# Patient Record
Sex: Female | Born: 1965 | Race: White | Hispanic: No | Marital: Married
Health system: Midwestern US, Academic
[De-identification: ages and names within clinical notes are randomized; demographics above are authoritative.]

---

## 2017-01-12 ENCOUNTER — Ambulatory Visit: Admit: 2017-01-12 | Discharge: 2017-01-13 | Payer: Private Health Insurance - Indemnity

## 2017-01-12 ENCOUNTER — Encounter: Admit: 2017-01-12 | Discharge: 2017-01-12 | Payer: BC Managed Care – HMO

## 2017-01-12 DIAGNOSIS — N200 Calculus of kidney: ICD-10-CM

## 2017-01-12 DIAGNOSIS — J302 Other seasonal allergic rhinitis: ICD-10-CM

## 2017-01-12 DIAGNOSIS — R0902 Hypoxemia: ICD-10-CM

## 2017-01-12 DIAGNOSIS — R4189 Other symptoms and signs involving cognitive functions and awareness: ICD-10-CM

## 2017-01-12 DIAGNOSIS — R51 Headache: ICD-10-CM

## 2017-01-12 DIAGNOSIS — J45909 Unspecified asthma, uncomplicated: Principal | ICD-10-CM

## 2017-01-12 DIAGNOSIS — N809 Endometriosis, unspecified: ICD-10-CM

## 2017-01-12 NOTE — Progress Notes
Neuropsychological Evaluation    Name: Ashley Gaines    MRN: [id number]    Date of Birth (age): 06-10-1965 (51 y.o.)    Date of Service: 01/12/2017    Referring Provider:  Fredia Sorrow, MD    Reason for Referral: The patient is a 51 year old female referred for neuropsychological evaluation due to reported memory loss.    Chief Complaint: Memory loss    Reported Symptoms: During the interview, the patient reported problems with memory, absentmindedness, word finding, multitasking, distractibility, decreased sense of humor, difficulty with reading, difficulty recalling dates, and decreased patience.    ADL/IADL: The patient reported that she is able to perform tasks of daily functioning but uses increased compensatory strategies, drives in a restricted capacity, has more difficulty with cooking and cleaning tasks, and is not able to remember if she has performed ADLs.  She also stated that she stopped working due to cognitive changes.    Duration/Course: The patient reported onset of cognitive changes following dilaudid overdosed in 2011 (was given too much when in the hospital recovering from surgery) with decline over time.    Medical History:   Medical diagnoses: fibromyalgia, sleep apnea (using CPAP), asthma, diverticulitis, irritable bowel, fatty liver, migraines, urinary frequency, high cholesterol, torn achilles tendon  Surgical History/Hospitalizations: The patient reported history of dilaudid overdosed in 2011 that caused respiratory distress when in the hospital recovering from abdominal adhesions/hernia repair surgery. She also noted history of kidney stones and lithotripsy as well as the following surgeries:  Procedure Laterality Date   ??? APPENDECTOMY     ??? CARPAL TUNNEL RELEASE      bilateral   ??? CHOLECYSTECTOMY     ??? HAND SURGERY Left     thumb   ??? HYSTERECTOMY     ??? KNEE SURGERY      meniscus repair   ??? LYSIS OF ADHESIONS      abdominal adhesiolysis surgery   ??? SHOULDER SURGERY      Rt & Lt Psychological Functioning: The patient reported being placed on antidepressant for management of fibromyalgia but denied specific treatment for anxiety or depression.  She denied current or past suicidal or homicidal ideation.  Substance Use: The patient denied history of substance use disorder.    Family Medical History: vascular disease in father, memory decline at old age in maternal grandfather    Medications:  ??? albuterol sulfate (PROAIR HFA IN) Inhale  by mouth into the lungs.   ??? ascorbic acid (VITAMIN C) 500 mg tablet Take 500 mg by mouth daily.   ??? cholecalciferol (VITAMIN D-3) 1,000 units tablet Take 2,000 Units by mouth daily.   ??? ESTRADIOL CYPIONATE (DEPO-ESTRADIOL IM) Inject 0.4 mg to area(s) as directed. Every 19 days    ??? fexofenadine(+) (ALLEGRA) 180 mg tablet Take 180 mg by mouth daily.     ??? fluoxetine (PROZAC) 20 mg capsule Take 20 mg by mouth daily.   ??? glucosamine HCl (GLUCOSAMINE (BULK) MISC) Use 150 mg as directed daily.   ??? L gasseri/B bifidum/B longum (PHILLIPS' COLON HEALTH PO) Take  by mouth daily.   ??? magnesium    ??? other medication 1 Dose. Medication Name & Strength: chondroitin msm    Dose(how many): 1103mg     Frequency(how often): qd   ??? ranitidine(+) (ZANTAC) 150 mg tablet Take 150 mg by mouth daily. Taking 1-2 times daily       Social/Developmental History:  Developmental: The patient denied known problems with development.  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  Stressors: Cognitive changes, husband will be losing his job in a few months    Neurobehavioral Status Exam:  The patient arrived early to appointment and was accompanied by her husband.  Grooming and dress was appropriate.  Ambulation was grossly within normal limits.  No abnormal movements were noted.  The patient was alert.  Speech was within normal limits for volume, rate, and prosody.  No problems with comprehension were noted.  Thought processes were grossly logical and goal-directed. She was able to recall detailed information about history and presenting concern that was corroborated by her husband.  Affect was mood congruent.  The patient was pleasant and cooperative.  No abnormal behaviors were noted during testing that were thought to impact performance.    Test List: Reola Calkins Depression Inventory - 2, Brief Test of Attention, Brief Visuospatial Memory Test - Revised, Callibrated Ideational Fluency Assessment, CNNS [employer], Epworth Sleepiness Scale, Grooved Pegboard, [employer] Adult [employer], [employer] Learning Test - Revised, Modified [employer], Animator, Rey Osterrieth Complex Figure Test, [employer] Comparison Test, State Trait Anxiety Inventory, Trail Making Test, WAIS-III Digit Span, WMS-IV Logical Memory    Test Results:  Motor: The patient completed a measure of fine motor speed and coordination with performance being above average bilaterally.    Cognitive: Performance validity testing was somewhat variable however performance on all of cognitive testing was within normal limits.  This suggests intact cognitive abilities in the areas of visuospatial functioning, attention/working memory, processing speed, executive functioning, memory, and language ability.    Self-report Inventories: The patient completed self-report inventories related to depression, anxiety, and excessive daytime sleepiness.  Scores were indicative of mild depression and a very high level of anxiety symptoms.  Reported level of excessive daytime sleepiness was high normal.    Summary and Conclusions: The patient is a 51 year old female referred for neuropsychological evaluation due to reported memory loss. During the interview, the patient reported problems with memory, absentmindedness, word finding, multitasking, distractibility, decreased sense of humor, difficulty with reading, difficulty recalling dates, and decreased patience. The patient reported that she is able to perform tasks of daily functioning but uses increased compensatory strategies, drives in a restricted capacity, has more difficulty with cooking and cleaning tasks, and is not able to remember if she has performed ADLs.  She also stated that she stopped working due to cognitive changes.The patient reported onset of cognitive changes following dilaudid overdosed in 2011 (was given too much when in the hospital recovering from surgery) with decline over time.    In contrast to the patient's report, cognitive testing showed entirely intact cognitive functioning.  Psychologically the patient did report symptoms consistent with mild depression and elevated anxiety.  It is likely that psychological factors contribute to reported cognitive change.  Additionally, chronic pain may be a contributing factor.  Based on the current testing there does not appear to be a neurological reason for reported cognitive symptoms.    Diagnostic Impressions: R41.89 Cognitive change    Recommendations:  ??? The patient was encouraged to seek treatment for emotional symptoms through use of medications or initiating counseling.  ??? The patient was encouraged to continue to work  with her providers to optimize management of pain symptoms.  The patient may benefit from learning behavioral strategies for controlling pain in addition to treatment with medications.  ??? The following compensatory strategies may be useful if not already employed: using lists, a calendar, and other reminders to decrease reliance on memory, taking notes and checking with others to ensure that she has correctly identified the main points when discussing important information, limiting distractions in the environment while completing cognitively taxing tasks, breaking large tasks into smaller pieces, taking frequent breaks while working, and increasing organization and structure both physically (e.g., having a specific place to put important items) and in daily routine.  ??? The patient is encouraged to engage in pleasant activities, particularly those involving physical exercise and social components as this has been linked with improved physical, emotional, and cognitive wellbeing.    Thank-you for the referral of this patient. Please contact me at [phone] for further information as needed.    Documentation time: total time for this evaluation included 77 minutes of psychometrist time in face-to-face test administration, 9 minutes of computerized testing, 24 minutes of neuropsychologist time in interview/neurobehavioral status examination, and 85 minutes of neuropsychologist time in face-to-face testing, feedback, and/or comprehensive report writing/integration of test findings, neurobehavioral examination findings, history, and available records.    This evaluation was conducted in response to a clinical referral and is not intended to answer questions that may be posed in a medico-legal context.    TEST RESULTS TABLE:  (provided for professional use ??? for interpretation, please see summary and impressions sections)    Grooved Pegboard+++ Raw T-score Percentile   Dominant 62 59 82   Nondominant 64 66 95         MSVT Raw     IR 85     DR 75     CNS 80     PA 70     FR 55           HART Estimated IQ Raw = 8 SS = 89 23rd percentile         ROCFT+++ Raw T-score Percentile   Copy 31 45 31   Time 261 37 10         WAIS-III Digit Span+++ Raw T-score Percentile   Forward 7 55 69   Backward 6 57 76   RDS 7           BTA+++ Raw T-score Percentile   Number 6 39 14   Letter 6 39 14   Total 12 35 7 TMT+++ Raw Score T-score Percentile   A 23 55 69   B 66 53 62         [place]+++ Raw = 67 T = 52 58th percentile         HVLT ??? R+++ Raw Score T-score Percentile   Trial 1 8     Trial 2 10     Trial 3 10     Total 28 56 73   Delay 9 48 42   Retention 90 48 42   Recognition Discrimination Index 11 53 62         WMS-IV Raw Score Scaled Score Percentile   Logical Memory I 27 11 63   Logical Memory II 25 12 75   Logical Memory Recognition 24 - 26-50         BVMT ??? R+++ Raw Score T-score Percentile   Trial 1 7     Trial 2 12  Trial 3 12     Total 31 66 95   Delay 12 68 96   Retention 100 54 66   Recognition Discrimination Index 6 54 66         BNT ??? 30 item+++ Raw = 29 T = 47 38th percentile         CIFA+++ Raw T-score Percentile   Category Total 56 59 82   Letter Total 35 58 79         M-WCST+++ Raw T-score Percentile   Number of Categories 6 56 73   Perseverative Errors 1 50 50   Total Errors 2 62 88   Percentage Perseverative Errors 50 38 12         BDI ??? 2 Raw = 18  Mild         STAI Raw  Percentile   State 45  93   Trait 48  97         ESS Raw = 7  High Normal   ++ corrected for age and education  +++ corrected for age, education, gender, and race

## 2017-01-13 DIAGNOSIS — R4189 Other symptoms and signs involving cognitive functions and awareness: Principal | ICD-10-CM

## 2017-02-14 ENCOUNTER — Encounter: Admit: 2017-02-14 | Discharge: 2017-02-14 | Payer: BC Managed Care – HMO

## 2017-02-14 DIAGNOSIS — M79671 Pain in right foot: Principal | ICD-10-CM

## 2017-02-21 ENCOUNTER — Ambulatory Visit: Admit: 2017-02-21 | Discharge: 2017-02-21 | Payer: Private Health Insurance - Indemnity

## 2017-02-21 ENCOUNTER — Encounter: Admit: 2017-02-21 | Discharge: 2017-02-21 | Payer: BC Managed Care – HMO

## 2017-02-21 ENCOUNTER — Ambulatory Visit: Admit: 2017-05-18 | Discharge: 2017-05-18 | Payer: Private Health Insurance - Indemnity

## 2017-02-21 DIAGNOSIS — M79671 Pain in right foot: Principal | ICD-10-CM

## 2017-02-21 DIAGNOSIS — M7661 Achilles tendinitis, right leg: Principal | ICD-10-CM

## 2017-02-21 MED ORDER — CEFAZOLIN INJ 1GM IVP
2 g | Freq: Once | INTRAVENOUS | 0 refills | Status: CN
Start: 2017-02-21 — End: ?

## 2017-02-21 NOTE — Progress Notes
Patient scheduled surgery during office visit.  Date of surgery determined based on availability of both patient and Abran DukeBryan Vopat, MD.  The patient was scheduled for Right Achilles takedown and repair on  At 3/6/20198.  Patient was provided with Abran DukeBryan Vopat, MD pre-surgery packet.      POV to be scheduled 2 weeks post op with Abran DukeBryan Vopat, MD. She will be called with arrival date and time for day of surgery once scheduling is completed.   Questions answered and reassurance given.  Instructed to call [phone] for further questions or problems.    Verbalized understanding of the instructions given.      Dr. Essie ChristineVopat and patient have agreed to schedule procedure as extended recovery after surgery: No    Vitals:  There were no vitals filed for this visit.  There is no height or weight on file to calculate BMI.   //

## 2017-02-23 NOTE — Progress Notes
Date of Service: 02/21/2017      Chief Complaint   Patient presents with   ??? New Patient     Right heel pain          Lantis, Ashley L.  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  Her husband will be having surgery with Dr. Wilkie Aye this Friday.  She has pain in the insertion of her Achilles at the heel.  This has been going on for about three years, but it has been worse over the past six months.  She has tried casting and physical therapy.  She continues to have pain.  She has a little bit of pain over the peroneal tendon, but mostly at the insertion of the Achilles is where it bothers her the most.  It is 5/10 pain now, and it can get worse throughout the day.    PHYSICAL EXAMINATION:  On physical examination, her bilateral lower extremities have normal alignment of the hips, knees, and ankles.    Her right lower extremity has tenderness to palpation right around the insertion of her Achilles.  She dorsiflexes to about neutral and plantarflexes 30???.  She has normal subtalar range of motion.  She has 5/5 strength dorsiflexion, plantarflexion, and eversion of the foot.  The skin is intact.  She has a 2+ DP pulse.    IMAGING:  X-ray examination demonstrates a significant enthesophyte over the right Achilles.    ASSESSMENT AND PLAN:  A 51 year old female with insertional Achilles tendinopathy.  At this time, we discussed possible treatment options.  I recommended an MRI.  She has failed conservative management, including a cast and physical therapy.  This will also be for preoperative planning.  As she has failed conservative management, I feel it would be reasonable to do an Achilles takedown or removal of the enthesophyte.  The risks and benefits were described to the patient, including, but not limited to, bleeding, infection, neurovascular injury, DVT, and continued pain.  She understood these risks and consented for surgery.        BV/abc:kap Abran Duke, MD         I have personally reviewed the patient intake form with the patient today, it was signed by me and scanned into O2. Please see below for details.         Past Medical History:  Past Medical History:   Diagnosis Date   ??? Asthma    ??? Cognitive impairment since 08/2008    /memory; most likely 2/2 adjustment reaction with depressive sx's as  she had a prolonged period of illness after sx   ??? Endometriosis    ??? Generalized headaches    ??? Hypoxia    ??? Kidney stones    ??? Seasonal allergic rhinitis        Past Surgical History:   Procedure Laterality Date   ??? APPENDECTOMY     ??? CARPAL TUNNEL RELEASE      bilateral   ??? CHOLECYSTECTOMY     ??? HAND SURGERY Left     thumb   ??? HYSTERECTOMY     ??? KNEE SURGERY      meniscus repair   ??? LYSIS OF ADHESIONS      abdominal adhesiolysis surgery   ??? SHOULDER SURGERY      Rt & Lt       Allergies:  Codeine and Morphine  Current Medications:  ??? albuterol sulfate (PROAIR HFA IN) Inhale  by mouth into the lungs.   ??? ascorbic acid (VITAMIN C) 500 mg tablet Take 500 mg by mouth daily.   ??? cholecalciferol (VITAMIN D-3) 1,000 units tablet Take 2,000 Units by mouth daily.   ??? ESTRADIOL CYPIONATE (DEPO-ESTRADIOL IM) Inject 0.4 mg to area(s) as directed. Every 19 days    ??? fexofenadine(+) (ALLEGRA) 180 mg tablet Take 180 mg by mouth daily.     ??? fluoxetine (PROZAC) 20 mg capsule Take 20 mg by mouth daily.   ??? glucosamine HCl (GLUCOSAMINE (BULK) MISC) Use 150 mg as directed daily.   ??? L gasseri/B bifidum/B longum (PHILLIPS' COLON HEALTH PO) Take  by mouth daily.   ??? magnesium    ??? other medication 1 Dose. Medication Name & Strength: chondroitin msm    Dose(how many): 1103mg     Frequency(how often): qd   ??? ranitidine(+) (ZANTAC) 150 mg tablet Take 150 mg by mouth daily. Taking 1-2 times daily       Social History:  Social History     Tobacco Use   Smoking Status Never Smoker     Social History Substance and Sexual Activity   Drug Use No     Social History     Substance and Sexual Activity   Alcohol Use No       Family History   Problem Relation Age of Onset   ??? Coronary Artery Disease Father    ??? Diabetes Father    ??? Transient Ischaemic Attack Father    ??? Heart problem Father    ??? Other Maternal Grandfather         memory decline at an old age   ??? Cancer Maternal Aunt    ??? Cancer Maternal Uncle          General Physical Exam:  General/Constitutional:No apparent distress: well-nourished and well developed.  Eyes: Sclera nonicteric, conjunctiva clear  Respiratory:No shortness of breath or dyspnea  Cardiac: No clubbing, cyanosis, or edema  Vascular: No edema, swelling or tenderness, except as noted in detailed exam.  Integumentary:No impressive skin lesions present, except as noted in detailed exam.  Neuro/Psych: Normal mood and affect, oriented to person, place and time.  Musculoskeletal: Normal, except as noted in detailed exam and in HPI.      Review Of Systems:  A 14 point review of systems including HEENT, cardiovascular, pulmonary, gastrointestinal,?genitourinary, psychiatric, neurologic, musculoskeletal, endocrine, and integumentary are negative unless otherwise noted in the history of present illness or on the signed intake form.        Objective:           There were no vitals filed for this visit.  There is no height or weight on file to calculate BMI.     ATTESTATION  I personally performed the E/M including history, physical exam, and MDM.    Staff name:  Abran Duke, MD Date:  02/23/2017          Abran Duke, MD    Portions of this noted may have been created using Dragon, a voice recognition software.  Please contact my office for any clarification of documentation    Please send a copy of office notes to the primary care physician and referring providers

## 2017-04-26 ENCOUNTER — Ambulatory Visit: Admit: 2017-04-26 | Discharge: 2017-04-26 | Payer: Private Health Insurance - Indemnity

## 2017-04-26 DIAGNOSIS — M7661 Achilles tendinitis, right leg: Principal | ICD-10-CM

## 2017-05-06 ENCOUNTER — Encounter: Admit: 2017-05-06 | Discharge: 2017-05-06 | Payer: BC Managed Care – HMO

## 2017-05-09 ENCOUNTER — Encounter: Admit: 2017-05-09 | Discharge: 2017-05-09 | Payer: BC Managed Care – HMO

## 2017-05-09 DIAGNOSIS — N809 Endometriosis, unspecified: ICD-10-CM

## 2017-05-09 DIAGNOSIS — J302 Other seasonal allergic rhinitis: ICD-10-CM

## 2017-05-09 DIAGNOSIS — G4730 Sleep apnea, unspecified: ICD-10-CM

## 2017-05-09 DIAGNOSIS — N200 Calculus of kidney: ICD-10-CM

## 2017-05-09 DIAGNOSIS — R51 Headache: ICD-10-CM

## 2017-05-09 DIAGNOSIS — R4189 Other symptoms and signs involving cognitive functions and awareness: ICD-10-CM

## 2017-05-09 DIAGNOSIS — R0902 Hypoxemia: ICD-10-CM

## 2017-05-09 DIAGNOSIS — J45909 Unspecified asthma, uncomplicated: Principal | ICD-10-CM

## 2017-05-09 NOTE — Pre-Anesthesia Patient Instructions
GENERAL INFORMATION    Before you come to the hospital  ??? Make arrangements for a responsible adult to drive you home and stay with you for 24 hours following surgery.  ??? Bath/Shower Instructions  ??? Please refer to Pre-Surgery Shower Instruction sheet.  ??? Leave money, credit cards, jewelry, and any other valuables at home. The [facility] is not responsible for the loss or breakage of personal items.  ??? Remove nail polish from surgery site/extremity, makeup and all jewelry (including piercings) before coming to the hospital.  ??? The morning of your procedure:  ??? brush your teeth and tongue  ??? do not smoke  ??? do not shave the area where you will have surgery    What to bring to the hospital  ??? ID/ Insurance Card  ??? Medical Device card  ??? Official documents for legal guardianship   ??? Copy of your Living Will, Advanced Directives, and/or Durable Power of Attorney   ??? Small bag with a few personal belongings  ??? Cases for glasses/hearing aids/contact lens (bring solutions for contacts)  ??? Dress in clean, loose, comfortable clothing   ??? Other personal items such as canes, walkers, and medications in original containers if applicable  ??? CPAP or BiPAP Machine: If it is a Philips/Respironics System One machine please bring machine/humidifier and tubing/mask. All other brands, please bring tubing/mask only on the day of surgery.     Eating or drinking before surgery  ??? Do not eat anything after 11:00 p.m. the day before your procedure (including gum, mints, candy, or chewing tobacco).  ??? Other instructions: You may have water until 5:30 a.m. day of surgery.     Other instructions  Notify your surgeon if:  ??? there is a possibility that you are pregnant  ??? you become ill with a cough, fever, sore throat, nausea, vomiting or flu-like symptoms  ??? you have any open wounds/sores that are red, painful, draining, or are new since you last saw  the doctor  ??? you need to cancel your procedure Notify us at [facility]: [phone]  ??? if you need to cancel your procedure  ??? if you are going to be late    Arrival at the hospital  [place] - Your surgery is scheduled on 05/18/17 at 7:30 a.m.  Please arrive at 5:45 a.m.  ??? The [address] is located at [employer]. This is at the [employer] of [address]  ??? Use the main entrance of the hospital, at the east side of the building. Parking is free.  ??? Check-in for surgery is inside the main entrance.

## 2017-05-09 NOTE — Pre-Anesthesia Medication Instructions
YOUR MEDICATIONS    Current Medications    Medication Directions   albuterol sulfate (PROAIR HFA IN) Inhale  by mouth into the lungs.   ascorbic acid (VITAMIN C) 500 mg tablet Take 500 mg by mouth daily.   cholecalciferol (VITAMIN D-3) 1,000 units tablet Take 2,000 Units by mouth daily.   ESTRADIOL CYPIONATE (DEPO-ESTRADIOL IM) Inject 0.4 mg to area(s) as directed. Every 19 days    fexofenadine(+) (ALLEGRA) 180 mg tablet Take 180 mg by mouth at bedtime daily.   fluoxetine (PROZAC) 20 mg capsule Take 20 mg by mouth daily.   glucosamine HCl (GLUCOSAMINE (BULK) MISC) Use 150 mg as directed daily.   L gasseri/B bifidum/B longum (PHILLIPS' COLON HEALTH PO) Take  by mouth daily.   magnesium    ranitidine(+) (ZANTAC) 150 mg tablet Take 150 mg by mouth daily. Taking 1-2 times daily         PLEASE BRING INHALER DAY OF SURGERY    Before surgery  Stop these medicines 14 days before surgery:  Glucosamine      DO NOT take the day of surgery: Vitamin C, Vitamin D, Magnesium, [facility]    Morning of surgery  On the morning of surgery, take ONLY these medicines with a sip (1-2 ounces) of water:  Fluoxetine      Before going home from the hospital, please ask your doctor when you should re-start your medicines that were stopped before surgery.

## 2017-05-12 ENCOUNTER — Encounter: Admit: 2017-05-12 | Discharge: 2017-05-12 | Payer: BC Managed Care – HMO

## 2017-05-12 MED ORDER — ASPIRIN 81 MG PO TBEC
81 mg | ORAL_TABLET | Freq: Every day | ORAL | 0 refills | Status: AC
Start: 2017-05-12 — End: ?

## 2017-05-12 MED ORDER — ONDANSETRON 8 MG PO TBDI
8 mg | ORAL_TABLET | ORAL | 0 refills | Status: SS | PRN
Start: 2017-05-12 — End: 2017-05-18

## 2017-05-12 MED ORDER — DOCUSATE SODIUM 100 MG PO CAP
100 mg | ORAL_CAPSULE | Freq: Two times a day (BID) | ORAL | 0 refills | Status: SS | PRN
Start: 2017-05-12 — End: 2017-05-18

## 2017-05-12 MED ORDER — OXYCODONE 5 MG PO TAB
5-10 mg | ORAL_TABLET | ORAL | 0 refills | 6.00000 days | Status: AC | PRN
Start: 2017-05-12 — End: 2017-05-30
  Filled 2017-05-18 (×2): qty 60, 5d supply, fill #1

## 2017-05-17 ENCOUNTER — Ambulatory Visit: Admit: 2017-05-17 | Discharge: 2017-05-17 | Payer: BC Managed Care – HMO

## 2017-05-17 ENCOUNTER — Encounter: Admit: 2017-05-17 | Discharge: 2017-05-17 | Payer: BC Managed Care – HMO

## 2017-05-17 DIAGNOSIS — R0902 Hypoxemia: ICD-10-CM

## 2017-05-17 DIAGNOSIS — N200 Calculus of kidney: ICD-10-CM

## 2017-05-17 DIAGNOSIS — J45909 Unspecified asthma, uncomplicated: Principal | ICD-10-CM

## 2017-05-17 DIAGNOSIS — N809 Endometriosis, unspecified: ICD-10-CM

## 2017-05-17 DIAGNOSIS — G4730 Sleep apnea, unspecified: ICD-10-CM

## 2017-05-17 DIAGNOSIS — J302 Other seasonal allergic rhinitis: ICD-10-CM

## 2017-05-17 DIAGNOSIS — R51 Headache: ICD-10-CM

## 2017-05-17 DIAGNOSIS — R4189 Other symptoms and signs involving cognitive functions and awareness: ICD-10-CM

## 2017-05-18 ENCOUNTER — Ambulatory Visit: Admit: 2017-05-18 | Discharge: 2017-05-18 | Payer: BC Managed Care – HMO

## 2017-05-18 ENCOUNTER — Encounter: Admit: 2017-05-18 | Discharge: 2017-05-18 | Payer: BC Managed Care – HMO

## 2017-05-18 DIAGNOSIS — M797 Fibromyalgia: ICD-10-CM

## 2017-05-18 DIAGNOSIS — M7661 Achilles tendinitis, right leg: Principal | ICD-10-CM

## 2017-05-18 DIAGNOSIS — M21071 Valgus deformity, not elsewhere classified, right ankle: ICD-10-CM

## 2017-05-18 DIAGNOSIS — M25871 Other specified joint disorders, right ankle and foot: ICD-10-CM

## 2017-05-18 DIAGNOSIS — M9261 Juvenile osteochondrosis of tarsus, right ankle: ICD-10-CM

## 2017-05-18 DIAGNOSIS — J45909 Unspecified asthma, uncomplicated: Principal | ICD-10-CM

## 2017-05-18 DIAGNOSIS — J302 Other seasonal allergic rhinitis: ICD-10-CM

## 2017-05-18 DIAGNOSIS — G43909 Migraine, unspecified, not intractable, without status migrainosus: ICD-10-CM

## 2017-05-18 DIAGNOSIS — R4189 Other symptoms and signs involving cognitive functions and awareness: ICD-10-CM

## 2017-05-18 DIAGNOSIS — N809 Endometriosis, unspecified: ICD-10-CM

## 2017-05-18 DIAGNOSIS — R51 Headache: ICD-10-CM

## 2017-05-18 DIAGNOSIS — G4730 Sleep apnea, unspecified: ICD-10-CM

## 2017-05-18 DIAGNOSIS — N200 Calculus of kidney: ICD-10-CM

## 2017-05-18 DIAGNOSIS — R0902 Hypoxemia: ICD-10-CM

## 2017-05-18 MED ORDER — LIDOCAINE (PF) 10 MG/ML (1 %) IJ SOLN
.1-2 mL | Freq: Once | INTRAMUSCULAR | 0 refills | Status: DC
Start: 2017-05-18 — End: 2017-05-18

## 2017-05-18 MED ORDER — ONDANSETRON HCL (PF) 4 MG/2 ML IJ SOLN
INTRAVENOUS | 0 refills | Status: DC
Start: 2017-05-18 — End: 2017-05-18
  Administered 2017-05-18: 17:00:00 4 mg via INTRAVENOUS

## 2017-05-18 MED ORDER — ROCURONIUM 10 MG/ML IV SOLN
INTRAVENOUS | 0 refills | Status: DC
Start: 2017-05-18 — End: 2017-05-18
  Administered 2017-05-18: 16:00:00 40 mg via INTRAVENOUS

## 2017-05-18 MED ORDER — MIDAZOLAM 1 MG/ML IJ SOLN
2 mg | Freq: Once | INTRAVENOUS | 0 refills | Status: CP
Start: 2017-05-18 — End: ?
  Administered 2017-05-18: 16:00:00 2 mg via INTRAVENOUS

## 2017-05-18 MED ORDER — DOCUSATE SODIUM 100 MG PO CAP
100 mg | ORAL_CAPSULE | Freq: Two times a day (BID) | ORAL | 0 refills | Status: AC | PRN
Start: 2017-05-18 — End: ?

## 2017-05-18 MED ORDER — BACITRACIN-POLYMYXIN B 500-10,000 UNIT/GRAM TP OINT
0 refills | Status: DC
Start: 2017-05-18 — End: 2017-05-18
  Administered 2017-05-18: 18:00:00 10 g via TOPICAL

## 2017-05-18 MED ORDER — FENTANYL CITRATE (PF) 50 MCG/ML IJ SOLN
0 refills | Status: DC
Start: 2017-05-18 — End: 2017-05-18
  Administered 2017-05-18: 16:00:00 50 ug via INTRAVENOUS

## 2017-05-18 MED ORDER — DEXAMETHASONE SODIUM PHOSPHATE 4 MG/ML IJ SOLN
INTRAVENOUS | 0 refills | Status: DC
Start: 2017-05-18 — End: 2017-05-18
  Administered 2017-05-18: 17:00:00 4 mg via INTRAVENOUS

## 2017-05-18 MED ORDER — ONDANSETRON HCL (PF) 4 MG/2 ML IJ SOLN
4 mg | Freq: Once | INTRAVENOUS | 0 refills | Status: DC | PRN
Start: 2017-05-18 — End: 2017-05-18

## 2017-05-18 MED ORDER — CEFAZOLIN INJ 1GM IVP
2 g | Freq: Once | INTRAVENOUS | 0 refills | Status: CP
Start: 2017-05-18 — End: ?
  Administered 2017-05-18: 17:00:00 2 g via INTRAVENOUS

## 2017-05-18 MED ORDER — PROPOFOL INJ 10 MG/ML IV VIAL
0 refills | Status: DC
Start: 2017-05-18 — End: 2017-05-18
  Administered 2017-05-18: 16:00:00 200 mg via INTRAVENOUS

## 2017-05-18 MED ORDER — LIDOCAINE (PF) 10 MG/ML (1 %) IJ SOLN
0 refills | Status: DC
Start: 2017-05-18 — End: 2017-05-18
  Administered 2017-05-18: 16:00:00 3 mL

## 2017-05-18 MED ORDER — FENTANYL CITRATE (PF) 50 MCG/ML IJ SOLN
25 ug | INTRAVENOUS | 0 refills | Status: DC | PRN
Start: 2017-05-18 — End: 2017-05-18

## 2017-05-18 MED ORDER — FENTANYL CITRATE (PF) 50 MCG/ML IJ SOLN
50 ug | INTRAVENOUS | 0 refills | Status: CN | PRN
Start: 2017-05-18 — End: ?

## 2017-05-18 MED ORDER — SUGAMMADEX 100 MG/ML IV SOLN
INTRAVENOUS | 0 refills | Status: DC
Start: 2017-05-18 — End: 2017-05-18
  Administered 2017-05-18: 18:00:00 175 mg via INTRAVENOUS

## 2017-05-18 MED ORDER — FENTANYL CITRATE (PF) 50 MCG/ML IJ SOLN
50 ug | INTRAVENOUS | 0 refills | Status: DC | PRN
Start: 2017-05-18 — End: 2017-05-18

## 2017-05-18 MED ORDER — FENTANYL CITRATE (PF) 50 MCG/ML IJ SOLN
25 ug | INTRAVENOUS | 0 refills | Status: CN | PRN
Start: 2017-05-18 — End: ?

## 2017-05-18 MED ORDER — MEPERIDINE (PF) 25 MG/ML IJ SYRG
12.5 mg | INTRAVENOUS | 0 refills | Status: DC | PRN
Start: 2017-05-18 — End: 2017-05-18

## 2017-05-18 MED ORDER — HALOPERIDOL LACTATE 5 MG/ML IJ SOLN
1 mg | Freq: Once | INTRAVENOUS | 0 refills | Status: DC | PRN
Start: 2017-05-18 — End: 2017-05-18

## 2017-05-18 MED ORDER — FENTANYL CITRATE (PF) 50 MCG/ML IJ SOLN
50-200 ug | Freq: Once | INTRAVENOUS | 0 refills | Status: CP
Start: 2017-05-18 — End: ?
  Administered 2017-05-18: 16:00:00 50 ug via INTRAVENOUS

## 2017-05-18 MED ORDER — ONDANSETRON 8 MG PO TBDI
8 mg | ORAL_TABLET | ORAL | 0 refills | 8.00000 days | Status: AC | PRN
Start: 2017-05-18 — End: 2017-12-12
  Filled 2017-05-18 (×2): qty 10, 4d supply, fill #1

## 2017-05-18 MED ORDER — LACTATED RINGERS IV SOLP
INTRAVENOUS | 0 refills | Status: DC
Start: 2017-05-18 — End: 2017-05-18
  Administered 2017-05-18 (×2): 1000.000 mL via INTRAVENOUS

## 2017-05-18 MED ORDER — MEPERIDINE (PF) 25 MG/ML IJ SYRG
12.5 mg | INTRAVENOUS | 0 refills | Status: CN | PRN
Start: 2017-05-18 — End: ?

## 2017-05-18 MED ORDER — BUPIVACAINE 0.5 % (5 MG/ML) IJ SOLN
0 refills | Status: DC
Start: 2017-05-18 — End: 2017-05-18
  Administered 2017-05-18: 16:00:00 30 mL

## 2017-05-18 MED ORDER — LIDOCAINE (PF) 200 MG/10 ML (2 %) IJ SYRG
0 refills | Status: DC
Start: 2017-05-18 — End: 2017-05-18
  Administered 2017-05-18: 16:00:00 100 mg via INTRAVENOUS

## 2017-05-18 NOTE — Other
Brief Operative Note    Name: Ashley Gaines is a 52 y.o. female     DOB: [birth year]             MRN#: [id number]  DATE OF OPERATION: 05/18/2017    Date:  05/18/2017        Preoperative Dx:   Right Achilles tendinitis [M76.61]    Post-op Diagnosis      * Right Achilles tendinitis [M76.61]    Procedure(s) (LRB):  RIGHT ACHILLES TAKEDOWN AND REPAIR (Right)    Anesthesia Type: Defer to Anesthesia    Surgeon(s) and Role:     * Marvin Maenza, Lowry Ram, MD - Primary      Findings:  Right achilles tendinoapathy    Estimated Blood Loss: No blood loss documented.     Specimen(s) Removed/Disposition: * No specimens in log *    Complications:  None    Implants: see dictation    Drains: None    Disposition:  PACU - stable    Abran Duke, MD  Pager

## 2017-05-18 NOTE — H&P (View-Only)
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  Her husband will be having surgery with Dr. Wilkie Aye this Friday.  She has pain in the insertion of her Achilles at the heel.  This has been going on for about three years, but it has been worse over the past six months.  She has tried casting and physical therapy.  She continues to have pain.  She has a little bit of pain over the peroneal tendon, but mostly at the insertion of the Achilles is where it bothers her the most.  It is 5/10 pain now, and it can get worse throughout the day.    PHYSICAL EXAMINATION:  On physical examination, her bilateral lower extremities have normal alignment of the hips, knees, and ankles.    Her right lower extremity has tenderness to palpation right around the insertion of her Achilles.  She dorsiflexes to about neutral and plantarflexes 30.  She has normal subtalar range of motion.  She has 5/5 strength dorsiflexion, plantarflexion, and eversion of the foot.  The skin is intact.  She has a 2+ DP pulse.    IMAGING:  X-ray examination demonstrates a significant enthesophyte over the right Achilles.    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  At this time, we discussed possible treatment options.  I recommended an MRI.  She has failed conservative management, including a cast and physical therapy.  This will also be for preoperative planning.  As she has failed conservative management, I feel it would be reasonable to do an Achilles takedown or removal of the enthesophyte.  The risks and benefits were described to the patient, including, but not limited to, bleeding, infection, neurovascular injury, DVT, and continued pain.  She understood these risks and consented for surgery

## 2017-05-20 ENCOUNTER — Encounter: Admit: 2017-05-20 | Discharge: 2017-05-20 | Payer: BC Managed Care – HMO

## 2017-05-20 DIAGNOSIS — J302 Other seasonal allergic rhinitis: ICD-10-CM

## 2017-05-20 DIAGNOSIS — N809 Endometriosis, unspecified: ICD-10-CM

## 2017-05-20 DIAGNOSIS — R4189 Other symptoms and signs involving cognitive functions and awareness: ICD-10-CM

## 2017-05-20 DIAGNOSIS — R51 Headache: ICD-10-CM

## 2017-05-20 DIAGNOSIS — N200 Calculus of kidney: ICD-10-CM

## 2017-05-20 DIAGNOSIS — G43909 Migraine, unspecified, not intractable, without status migrainosus: ICD-10-CM

## 2017-05-20 DIAGNOSIS — R0902 Hypoxemia: ICD-10-CM

## 2017-05-20 DIAGNOSIS — J45909 Unspecified asthma, uncomplicated: Principal | ICD-10-CM

## 2017-05-20 DIAGNOSIS — M797 Fibromyalgia: ICD-10-CM

## 2017-05-20 DIAGNOSIS — G4730 Sleep apnea, unspecified: ICD-10-CM

## 2017-05-30 ENCOUNTER — Ambulatory Visit: Admit: 2017-05-30 | Discharge: 2017-05-30 | Payer: Private Health Insurance - Indemnity

## 2017-05-30 DIAGNOSIS — Z9889 Other specified postprocedural states: Principal | ICD-10-CM

## 2017-05-30 MED ORDER — OXYCODONE 5 MG PO TAB
5-10 mg | ORAL_TABLET | ORAL | 0 refills | 6.00000 days | Status: AC | PRN
Start: 2017-05-30 — End: 2017-06-06

## 2017-06-06 ENCOUNTER — Ambulatory Visit: Admit: 2017-06-06 | Discharge: 2017-06-06 | Payer: Private Health Insurance - Indemnity

## 2017-06-06 ENCOUNTER — Encounter: Admit: 2017-06-06 | Discharge: 2017-06-06 | Payer: BC Managed Care – HMO

## 2017-06-06 DIAGNOSIS — R51 Headache: ICD-10-CM

## 2017-06-06 DIAGNOSIS — N809 Endometriosis, unspecified: ICD-10-CM

## 2017-06-06 DIAGNOSIS — G4730 Sleep apnea, unspecified: ICD-10-CM

## 2017-06-06 DIAGNOSIS — M797 Fibromyalgia: ICD-10-CM

## 2017-06-06 DIAGNOSIS — G43909 Migraine, unspecified, not intractable, without status migrainosus: ICD-10-CM

## 2017-06-06 DIAGNOSIS — R4189 Other symptoms and signs involving cognitive functions and awareness: ICD-10-CM

## 2017-06-06 DIAGNOSIS — Z9889 Other specified postprocedural states: Principal | ICD-10-CM

## 2017-06-06 DIAGNOSIS — R0902 Hypoxemia: ICD-10-CM

## 2017-06-06 DIAGNOSIS — J45909 Unspecified asthma, uncomplicated: Principal | ICD-10-CM

## 2017-06-06 DIAGNOSIS — N200 Calculus of kidney: ICD-10-CM

## 2017-06-06 DIAGNOSIS — J302 Other seasonal allergic rhinitis: ICD-10-CM

## 2017-06-06 MED ORDER — OXYCODONE 5 MG PO TAB
5-10 mg | ORAL_TABLET | ORAL | 0 refills | 6.00000 days | Status: AC | PRN
Start: 2017-06-06 — End: 2017-12-12

## 2017-06-16 ENCOUNTER — Encounter: Admit: 2017-06-16 | Discharge: 2017-06-16 | Payer: BC Managed Care – HMO

## 2017-06-16 DIAGNOSIS — J302 Other seasonal allergic rhinitis: ICD-10-CM

## 2017-06-16 DIAGNOSIS — R51 Headache: ICD-10-CM

## 2017-06-16 DIAGNOSIS — J45909 Unspecified asthma, uncomplicated: Principal | ICD-10-CM

## 2017-06-16 DIAGNOSIS — R0902 Hypoxemia: ICD-10-CM

## 2017-06-16 DIAGNOSIS — R4189 Other symptoms and signs involving cognitive functions and awareness: ICD-10-CM

## 2017-06-16 DIAGNOSIS — G4730 Sleep apnea, unspecified: ICD-10-CM

## 2017-06-16 DIAGNOSIS — N809 Endometriosis, unspecified: ICD-10-CM

## 2017-06-16 DIAGNOSIS — N200 Calculus of kidney: ICD-10-CM

## 2017-06-16 DIAGNOSIS — M797 Fibromyalgia: ICD-10-CM

## 2017-06-16 DIAGNOSIS — G43909 Migraine, unspecified, not intractable, without status migrainosus: ICD-10-CM

## 2017-06-28 ENCOUNTER — Ambulatory Visit: Admit: 2017-06-28 | Discharge: 2017-06-28 | Payer: BC Managed Care – HMO

## 2017-06-28 ENCOUNTER — Ambulatory Visit: Admit: 2017-06-28 | Discharge: 2017-06-28 | Payer: Private Health Insurance - Indemnity

## 2017-06-28 ENCOUNTER — Encounter: Admit: 2017-06-28 | Discharge: 2017-06-28 | Payer: BC Managed Care – HMO

## 2017-06-28 DIAGNOSIS — N200 Calculus of kidney: ICD-10-CM

## 2017-06-28 DIAGNOSIS — J302 Other seasonal allergic rhinitis: ICD-10-CM

## 2017-06-28 DIAGNOSIS — R0902 Hypoxemia: ICD-10-CM

## 2017-06-28 DIAGNOSIS — Z9889 Other specified postprocedural states: Principal | ICD-10-CM

## 2017-06-28 DIAGNOSIS — G4730 Sleep apnea, unspecified: ICD-10-CM

## 2017-06-28 DIAGNOSIS — N809 Endometriosis, unspecified: ICD-10-CM

## 2017-06-28 DIAGNOSIS — M7661 Achilles tendinitis, right leg: ICD-10-CM

## 2017-06-28 DIAGNOSIS — G43909 Migraine, unspecified, not intractable, without status migrainosus: ICD-10-CM

## 2017-06-28 DIAGNOSIS — J45909 Unspecified asthma, uncomplicated: Principal | ICD-10-CM

## 2017-06-28 DIAGNOSIS — R51 Headache: ICD-10-CM

## 2017-06-28 DIAGNOSIS — M797 Fibromyalgia: ICD-10-CM

## 2017-06-28 DIAGNOSIS — R4189 Other symptoms and signs involving cognitive functions and awareness: ICD-10-CM

## 2017-08-16 ENCOUNTER — Ambulatory Visit: Admit: 2017-08-16 | Discharge: 2017-08-17 | Payer: Private Health Insurance - Indemnity

## 2017-08-16 ENCOUNTER — Encounter: Admit: 2017-08-16 | Discharge: 2017-08-16 | Payer: BC Managed Care – HMO

## 2017-08-16 DIAGNOSIS — G4730 Sleep apnea, unspecified: ICD-10-CM

## 2017-08-16 DIAGNOSIS — R51 Headache: ICD-10-CM

## 2017-08-16 DIAGNOSIS — J45909 Unspecified asthma, uncomplicated: Principal | ICD-10-CM

## 2017-08-16 DIAGNOSIS — N809 Endometriosis, unspecified: ICD-10-CM

## 2017-08-16 DIAGNOSIS — N200 Calculus of kidney: ICD-10-CM

## 2017-08-16 DIAGNOSIS — G43909 Migraine, unspecified, not intractable, without status migrainosus: ICD-10-CM

## 2017-08-16 DIAGNOSIS — R4189 Other symptoms and signs involving cognitive functions and awareness: ICD-10-CM

## 2017-08-16 DIAGNOSIS — J302 Other seasonal allergic rhinitis: ICD-10-CM

## 2017-08-16 DIAGNOSIS — M797 Fibromyalgia: ICD-10-CM

## 2017-08-16 DIAGNOSIS — R0902 Hypoxemia: ICD-10-CM

## 2017-08-16 DIAGNOSIS — M7661 Achilles tendinitis, right leg: ICD-10-CM

## 2017-08-16 DIAGNOSIS — Z9889 Other specified postprocedural states: Principal | ICD-10-CM

## 2017-09-19 ENCOUNTER — Encounter: Admit: 2017-09-19 | Discharge: 2017-09-19 | Payer: BC Managed Care – HMO

## 2017-10-19 ENCOUNTER — Encounter: Admit: 2017-10-19 | Discharge: 2017-10-20

## 2017-11-23 ENCOUNTER — Encounter: Admit: 2017-11-23 | Discharge: 2017-11-23 | Payer: BC Managed Care – HMO

## 2017-11-23 DIAGNOSIS — Z9889 Other specified postprocedural states: Principal | ICD-10-CM

## 2017-11-23 DIAGNOSIS — M79672 Pain in left foot: ICD-10-CM

## 2017-11-28 ENCOUNTER — Ambulatory Visit: Admit: 2017-11-28 | Discharge: 2017-11-28 | Payer: BC Managed Care – HMO

## 2017-11-28 ENCOUNTER — Ambulatory Visit: Admit: 2017-11-28 | Discharge: 2017-11-28 | Payer: Private Health Insurance - Indemnity

## 2017-11-28 ENCOUNTER — Encounter: Admit: 2017-11-28 | Discharge: 2017-11-28 | Payer: BC Managed Care – HMO

## 2017-11-28 DIAGNOSIS — G43909 Migraine, unspecified, not intractable, without status migrainosus: ICD-10-CM

## 2017-11-28 DIAGNOSIS — M7662 Achilles tendinitis, left leg: Principal | ICD-10-CM

## 2017-11-28 DIAGNOSIS — M7661 Achilles tendinitis, right leg: ICD-10-CM

## 2017-11-28 DIAGNOSIS — J302 Other seasonal allergic rhinitis: ICD-10-CM

## 2017-11-28 DIAGNOSIS — M7660 Achilles tendinitis, unspecified leg: Principal | ICD-10-CM

## 2017-11-28 DIAGNOSIS — J45909 Unspecified asthma, uncomplicated: Principal | ICD-10-CM

## 2017-11-28 DIAGNOSIS — M25572 Pain in left ankle and joints of left foot: Principal | ICD-10-CM

## 2017-11-28 DIAGNOSIS — R51 Headache: ICD-10-CM

## 2017-11-28 DIAGNOSIS — M797 Fibromyalgia: ICD-10-CM

## 2017-11-28 DIAGNOSIS — R0902 Hypoxemia: ICD-10-CM

## 2017-11-28 DIAGNOSIS — N200 Calculus of kidney: ICD-10-CM

## 2017-11-28 DIAGNOSIS — N809 Endometriosis, unspecified: ICD-10-CM

## 2017-11-28 DIAGNOSIS — R4189 Other symptoms and signs involving cognitive functions and awareness: ICD-10-CM

## 2017-11-28 DIAGNOSIS — G4730 Sleep apnea, unspecified: ICD-10-CM

## 2017-11-28 MED ORDER — CEFAZOLIN INJ 1GM IVP
2 g | Freq: Once | INTRAVENOUS | 0 refills | Status: CN
Start: 2017-11-28 — End: ?

## 2017-11-30 ENCOUNTER — Encounter: Admit: 2017-11-30 | Discharge: 2017-11-30 | Payer: BC Managed Care – HMO

## 2017-12-09 ENCOUNTER — Encounter: Admit: 2017-12-09 | Discharge: 2017-12-10

## 2017-12-09 ENCOUNTER — Encounter: Admit: 2017-12-09 | Discharge: 2017-12-09

## 2017-12-12 ENCOUNTER — Encounter: Admit: 2017-12-12 | Discharge: 2017-12-12 | Payer: BC Managed Care – HMO

## 2017-12-12 DIAGNOSIS — M797 Fibromyalgia: ICD-10-CM

## 2017-12-12 DIAGNOSIS — N200 Calculus of kidney: Principal | ICD-10-CM

## 2017-12-12 DIAGNOSIS — J45909 Unspecified asthma, uncomplicated: ICD-10-CM

## 2017-12-12 DIAGNOSIS — R4189 Other symptoms and signs involving cognitive functions and awareness: ICD-10-CM

## 2017-12-12 DIAGNOSIS — G4730 Sleep apnea, unspecified: ICD-10-CM

## 2017-12-12 DIAGNOSIS — G43909 Migraine, unspecified, not intractable, without status migrainosus: ICD-10-CM

## 2017-12-12 DIAGNOSIS — R0902 Hypoxemia: ICD-10-CM

## 2017-12-16 ENCOUNTER — Encounter: Admit: 2017-12-16 | Discharge: 2017-12-17

## 2017-12-19 ENCOUNTER — Encounter: Admit: 2017-12-19 | Discharge: 2017-12-19 | Payer: BC Managed Care – HMO

## 2017-12-22 ENCOUNTER — Encounter: Admit: 2017-12-22 | Discharge: 2017-12-22 | Payer: BC Managed Care – HMO

## 2017-12-22 ENCOUNTER — Ambulatory Visit: Admit: 2017-12-22 | Discharge: 2017-12-22 | Payer: Private Health Insurance - Indemnity

## 2017-12-22 ENCOUNTER — Ambulatory Visit: Admit: 2017-12-22 | Discharge: 2017-12-22 | Payer: BC Managed Care – HMO

## 2017-12-22 DIAGNOSIS — M216X2 Other acquired deformities of left foot: ICD-10-CM

## 2017-12-22 DIAGNOSIS — G4730 Sleep apnea, unspecified: ICD-10-CM

## 2017-12-22 DIAGNOSIS — M7662 Achilles tendinitis, left leg: Principal | ICD-10-CM

## 2017-12-22 DIAGNOSIS — Z7951 Long term (current) use of inhaled steroids: ICD-10-CM

## 2017-12-22 DIAGNOSIS — Z79899 Other long term (current) drug therapy: ICD-10-CM

## 2017-12-22 DIAGNOSIS — Z888 Allergy status to other drugs, medicaments and biological substances status: ICD-10-CM

## 2017-12-22 DIAGNOSIS — Z8659 Personal history of other mental and behavioral disorders: ICD-10-CM

## 2017-12-22 DIAGNOSIS — M797 Fibromyalgia: ICD-10-CM

## 2017-12-22 DIAGNOSIS — J45909 Unspecified asthma, uncomplicated: ICD-10-CM

## 2017-12-22 DIAGNOSIS — M722 Plantar fascial fibromatosis: ICD-10-CM

## 2017-12-22 DIAGNOSIS — Z885 Allergy status to narcotic agent status: ICD-10-CM

## 2017-12-22 DIAGNOSIS — N200 Calculus of kidney: Principal | ICD-10-CM

## 2017-12-22 DIAGNOSIS — R0902 Hypoxemia: ICD-10-CM

## 2017-12-22 DIAGNOSIS — R4189 Other symptoms and signs involving cognitive functions and awareness: ICD-10-CM

## 2017-12-22 DIAGNOSIS — G43909 Migraine, unspecified, not intractable, without status migrainosus: ICD-10-CM

## 2017-12-22 MED ORDER — ALBUTEROL SULFATE 2.5 MG /3 ML (0.083 %) IN NEBU
2.5 mg | RESPIRATORY_TRACT | 0 refills | Status: DC | PRN
Start: 2017-12-22 — End: 2017-12-22

## 2017-12-22 MED ORDER — ASPIRIN 81 MG PO TBEC
81 mg | ORAL_TABLET | Freq: Every day | ORAL | 0 refills | Status: AC
Start: 2017-12-22 — End: 2018-01-31
  Filled 2017-12-22 (×2): qty 21, 21d supply, fill #1

## 2017-12-22 MED ORDER — SUGAMMADEX 100 MG/ML IV SOLN
INTRAVENOUS | 0 refills | Status: DC
Start: 2017-12-22 — End: 2017-12-22
  Administered 2017-12-22: 15:00:00 190 mg via INTRAVENOUS

## 2017-12-22 MED ORDER — OXYCODONE 5 MG PO TAB
5 mg | ORAL_TABLET | ORAL | 0 refills | 6.00000 days | Status: AC | PRN
Start: 2017-12-22 — End: 2018-01-31
  Filled 2017-12-22 (×2): qty 60, 10d supply, fill #1

## 2017-12-22 MED ORDER — PROPOFOL INJ 10 MG/ML IV VIAL
0 refills | Status: DC
Start: 2017-12-22 — End: 2017-12-22
  Administered 2017-12-22: 13:00:00 200 mg via INTRAVENOUS

## 2017-12-22 MED ORDER — ALBUTEROL SULFATE 2.5 MG/0.5 ML IN NEBU
2.5 mg | RESPIRATORY_TRACT | 0 refills | Status: DC | PRN
Start: 2017-12-22 — End: 2017-12-22

## 2017-12-22 MED ORDER — ROCURONIUM 10 MG/ML IV SOLN
INTRAVENOUS | 0 refills | Status: DC
Start: 2017-12-22 — End: 2017-12-22
  Administered 2017-12-22: 13:00:00 50 mg via INTRAVENOUS

## 2017-12-22 MED ORDER — ONDANSETRON HCL 4 MG PO TAB
4 mg | ORAL_TABLET | ORAL | 0 refills | 8.00000 days | Status: AC | PRN
Start: 2017-12-22 — End: 2018-02-13
  Filled 2017-12-22 (×2): qty 30, 10d supply, fill #1

## 2017-12-22 MED ORDER — LIDOCAINE (PF) 10 MG/ML (1 %) IJ SOLN
.1-2 mL | INTRAMUSCULAR | 0 refills | Status: DC | PRN
Start: 2017-12-22 — End: 2017-12-22
  Administered 2017-12-22: 12:00:00 0.1 mL via INTRAMUSCULAR

## 2017-12-22 MED ORDER — FENTANYL CITRATE (PF) 50 MCG/ML IJ SOLN
50 ug | INTRAVENOUS | 0 refills | Status: DC | PRN
Start: 2017-12-22 — End: 2017-12-22

## 2017-12-22 MED ORDER — DEXAMETHASONE SODIUM PHOSPHATE 10 MG/ML IJ SOLN
0 refills | Status: DC
Start: 2017-12-22 — End: 2017-12-22
  Administered 2017-12-22: 14:00:00 4 mg via INTRAVENOUS

## 2017-12-22 MED ORDER — ROPIVACAINE (PF) 5 MG/ML (0.5 %) IJ SOLN
0 refills | Status: CP
Start: 2017-12-22 — End: ?
  Administered 2017-12-22: 13:00:00 30 mL

## 2017-12-22 MED ORDER — METOCLOPRAMIDE HCL 5 MG/ML IJ SOLN
10 mg | Freq: Once | INTRAVENOUS | 0 refills | Status: DC | PRN
Start: 2017-12-22 — End: 2017-12-22

## 2017-12-22 MED ORDER — FENTANYL CITRATE (PF) 50 MCG/ML IJ SOLN
25 ug | INTRAVENOUS | 0 refills | Status: DC | PRN
Start: 2017-12-22 — End: 2017-12-22

## 2017-12-22 MED ORDER — ONDANSETRON HCL (PF) 4 MG/2 ML IJ SOLN
0 refills | Status: DC
Start: 2017-12-22 — End: 2017-12-22
  Administered 2017-12-22: 14:00:00 4 mg via INTRAVENOUS

## 2017-12-22 MED ORDER — MIDAZOLAM 1 MG/ML IJ SOLN
INTRAVENOUS | 0 refills | Status: CP
Start: 2017-12-22 — End: ?
  Administered 2017-12-22: 13:00:00 2 mg via INTRAVENOUS

## 2017-12-22 MED ORDER — CEFAZOLIN INJ 1GM IVP
2 g | Freq: Once | INTRAVENOUS | 0 refills | Status: CP
Start: 2017-12-22 — End: ?
  Administered 2017-12-22: 14:00:00 2 g via INTRAVENOUS

## 2017-12-22 MED ORDER — FENTANYL CITRATE (PF) 50 MCG/ML IJ SOLN
0 refills | Status: DC
Start: 2017-12-22 — End: 2017-12-22
  Administered 2017-12-22: 13:00:00 100 ug via INTRAVENOUS

## 2017-12-22 MED ORDER — FENTANYL CITRATE (PF) 50 MCG/ML IJ SOLN
INTRAVENOUS | 0 refills | Status: CP
Start: 2017-12-22 — End: ?
  Administered 2017-12-22: 13:00:00 50 ug via INTRAVENOUS

## 2017-12-22 MED ORDER — MIDAZOLAM 1 MG/ML IJ SOLN
INTRAVENOUS | 0 refills | Status: DC
Start: 2017-12-22 — End: 2017-12-22

## 2017-12-22 MED ORDER — LACTATED RINGERS IV SOLP
INTRAVENOUS | 0 refills | Status: DC
Start: 2017-12-22 — End: 2017-12-22
  Administered 2017-12-22: 12:00:00 1000.000 mL via INTRAVENOUS

## 2017-12-22 MED ORDER — PHENYLEPHRINE IN 0.9% NACL(PF) 1 MG/10 ML (100 MCG/ML) IV SYRG
INTRAVENOUS | 0 refills | Status: DC
Start: 2017-12-22 — End: 2017-12-22
  Administered 2017-12-22 (×7): 100 ug via INTRAVENOUS

## 2017-12-22 MED ORDER — BACITRACIN ZINC 500 UNIT/GRAM TP OINT
0 refills | Status: DC
Start: 2017-12-22 — End: 2017-12-22
  Administered 2017-12-22: 14:00:00 1 via TOPICAL

## 2017-12-22 MED ORDER — LIDOCAINE (PF) 200 MG/10 ML (2 %) IJ SYRG
0 refills | Status: DC
Start: 2017-12-22 — End: 2017-12-22
  Administered 2017-12-22: 13:00:00 100 mg via INTRAVENOUS

## 2017-12-22 MED ADMIN — ALBUTEROL SULFATE 2.5 MG /3 ML (0.083 %) IN NEBU [250]: 2.5 mg | RESPIRATORY_TRACT | @ 15:00:00 | Stop: 2017-12-22 | NDC 00378827031

## 2017-12-26 ENCOUNTER — Encounter: Admit: 2017-12-26 | Discharge: 2017-12-26 | Payer: BC Managed Care – HMO

## 2017-12-26 DIAGNOSIS — M797 Fibromyalgia: ICD-10-CM

## 2017-12-26 DIAGNOSIS — R0902 Hypoxemia: ICD-10-CM

## 2017-12-26 DIAGNOSIS — R4189 Other symptoms and signs involving cognitive functions and awareness: ICD-10-CM

## 2017-12-26 DIAGNOSIS — G4730 Sleep apnea, unspecified: ICD-10-CM

## 2017-12-26 DIAGNOSIS — N200 Calculus of kidney: Principal | ICD-10-CM

## 2017-12-26 DIAGNOSIS — J45909 Unspecified asthma, uncomplicated: ICD-10-CM

## 2017-12-26 DIAGNOSIS — G43909 Migraine, unspecified, not intractable, without status migrainosus: ICD-10-CM

## 2017-12-27 ENCOUNTER — Ambulatory Visit: Admit: 2017-12-27 | Discharge: 2017-12-28 | Payer: Private Health Insurance - Indemnity

## 2017-12-27 ENCOUNTER — Encounter: Admit: 2017-12-27 | Discharge: 2017-12-27 | Payer: BC Managed Care – HMO

## 2017-12-27 DIAGNOSIS — J45909 Unspecified asthma, uncomplicated: ICD-10-CM

## 2017-12-27 DIAGNOSIS — R4189 Other symptoms and signs involving cognitive functions and awareness: ICD-10-CM

## 2017-12-27 DIAGNOSIS — G43909 Migraine, unspecified, not intractable, without status migrainosus: ICD-10-CM

## 2017-12-27 DIAGNOSIS — M797 Fibromyalgia: ICD-10-CM

## 2017-12-27 DIAGNOSIS — G4730 Sleep apnea, unspecified: ICD-10-CM

## 2017-12-27 DIAGNOSIS — M50123 Cervical disc disorder at C6-C7 level with radiculopathy: Principal | ICD-10-CM

## 2017-12-27 DIAGNOSIS — 1 ERRONEOUS ENCOUNTER--DISREGARD: Principal | ICD-10-CM

## 2017-12-27 DIAGNOSIS — N200 Calculus of kidney: Principal | ICD-10-CM

## 2017-12-27 DIAGNOSIS — R0902 Hypoxemia: ICD-10-CM

## 2017-12-28 ENCOUNTER — Encounter: Admit: 2017-12-28 | Discharge: 2017-12-28 | Payer: BC Managed Care – HMO

## 2018-01-02 ENCOUNTER — Encounter: Admit: 2018-01-02 | Discharge: 2018-01-02 | Payer: BC Managed Care – HMO

## 2018-01-02 ENCOUNTER — Ambulatory Visit: Admit: 2018-01-02 | Discharge: 2018-01-03 | Payer: Private Health Insurance - Indemnity

## 2018-01-02 DIAGNOSIS — R4189 Other symptoms and signs involving cognitive functions and awareness: ICD-10-CM

## 2018-01-02 DIAGNOSIS — N200 Calculus of kidney: Principal | ICD-10-CM

## 2018-01-02 DIAGNOSIS — J45909 Unspecified asthma, uncomplicated: ICD-10-CM

## 2018-01-02 DIAGNOSIS — G4730 Sleep apnea, unspecified: ICD-10-CM

## 2018-01-02 DIAGNOSIS — G43909 Migraine, unspecified, not intractable, without status migrainosus: ICD-10-CM

## 2018-01-02 DIAGNOSIS — R0902 Hypoxemia: ICD-10-CM

## 2018-01-02 DIAGNOSIS — M797 Fibromyalgia: ICD-10-CM

## 2018-01-03 DIAGNOSIS — Z9889 Other specified postprocedural states: Principal | ICD-10-CM

## 2018-01-09 ENCOUNTER — Ambulatory Visit: Admit: 2018-01-09 | Discharge: 2018-01-10 | Payer: Private Health Insurance - Indemnity

## 2018-01-16 ENCOUNTER — Encounter: Admit: 2018-01-16 | Discharge: 2018-01-16 | Payer: BC Managed Care – HMO

## 2018-01-16 ENCOUNTER — Ambulatory Visit: Admit: 2018-01-16 | Discharge: 2018-01-17 | Payer: Private Health Insurance - Indemnity

## 2018-01-16 DIAGNOSIS — G4730 Sleep apnea, unspecified: ICD-10-CM

## 2018-01-16 DIAGNOSIS — R0902 Hypoxemia: ICD-10-CM

## 2018-01-16 DIAGNOSIS — G43909 Migraine, unspecified, not intractable, without status migrainosus: ICD-10-CM

## 2018-01-16 DIAGNOSIS — N200 Calculus of kidney: Principal | ICD-10-CM

## 2018-01-16 DIAGNOSIS — M7662 Achilles tendinitis, left leg: Principal | ICD-10-CM

## 2018-01-16 DIAGNOSIS — M797 Fibromyalgia: ICD-10-CM

## 2018-01-16 DIAGNOSIS — R4189 Other symptoms and signs involving cognitive functions and awareness: ICD-10-CM

## 2018-01-16 DIAGNOSIS — J45909 Unspecified asthma, uncomplicated: ICD-10-CM

## 2018-01-31 ENCOUNTER — Ambulatory Visit: Admit: 2018-01-31 | Discharge: 2018-02-01 | Payer: Private Health Insurance - Indemnity

## 2018-01-31 ENCOUNTER — Encounter: Admit: 2018-01-31 | Discharge: 2018-01-31 | Payer: BC Managed Care – HMO

## 2018-01-31 DIAGNOSIS — N200 Calculus of kidney: Principal | ICD-10-CM

## 2018-01-31 DIAGNOSIS — M5010 Cervical disc disorder with radiculopathy, unspecified cervical region: Principal | ICD-10-CM

## 2018-01-31 DIAGNOSIS — G4730 Sleep apnea, unspecified: ICD-10-CM

## 2018-01-31 DIAGNOSIS — R4189 Other symptoms and signs involving cognitive functions and awareness: ICD-10-CM

## 2018-01-31 DIAGNOSIS — G43909 Migraine, unspecified, not intractable, without status migrainosus: ICD-10-CM

## 2018-01-31 DIAGNOSIS — R0902 Hypoxemia: ICD-10-CM

## 2018-01-31 DIAGNOSIS — M797 Fibromyalgia: ICD-10-CM

## 2018-01-31 DIAGNOSIS — J45909 Unspecified asthma, uncomplicated: ICD-10-CM

## 2018-01-31 MED ORDER — CEFAZOLIN INJ 1GM IVP
3 g | Freq: Once | INTRAVENOUS | 0 refills | Status: CN
Start: 2018-01-31 — End: ?

## 2018-02-01 DIAGNOSIS — M50123 Cervical disc disorder at C6-C7 level with radiculopathy: Principal | ICD-10-CM

## 2018-02-05 ENCOUNTER — Encounter: Admit: 2018-02-05 | Discharge: 2018-02-05 | Payer: BC Managed Care – HMO

## 2018-02-05 DIAGNOSIS — R0902 Hypoxemia: ICD-10-CM

## 2018-02-05 DIAGNOSIS — G43909 Migraine, unspecified, not intractable, without status migrainosus: ICD-10-CM

## 2018-02-05 DIAGNOSIS — N200 Calculus of kidney: Principal | ICD-10-CM

## 2018-02-05 DIAGNOSIS — G4730 Sleep apnea, unspecified: ICD-10-CM

## 2018-02-05 DIAGNOSIS — R4189 Other symptoms and signs involving cognitive functions and awareness: ICD-10-CM

## 2018-02-05 DIAGNOSIS — J45909 Unspecified asthma, uncomplicated: ICD-10-CM

## 2018-02-05 DIAGNOSIS — M797 Fibromyalgia: ICD-10-CM

## 2018-02-13 ENCOUNTER — Encounter: Admit: 2018-02-13 | Discharge: 2018-02-13 | Payer: BC Managed Care – HMO

## 2018-02-13 ENCOUNTER — Ambulatory Visit: Admit: 2018-02-13 | Discharge: 2018-02-14 | Payer: Private Health Insurance - Indemnity

## 2018-02-13 DIAGNOSIS — G4730 Sleep apnea, unspecified: ICD-10-CM

## 2018-02-13 DIAGNOSIS — R4189 Other symptoms and signs involving cognitive functions and awareness: ICD-10-CM

## 2018-02-13 DIAGNOSIS — T753XXA Motion sickness, initial encounter: ICD-10-CM

## 2018-02-13 DIAGNOSIS — R0902 Hypoxemia: ICD-10-CM

## 2018-02-13 DIAGNOSIS — N200 Calculus of kidney: Principal | ICD-10-CM

## 2018-02-13 DIAGNOSIS — M797 Fibromyalgia: ICD-10-CM

## 2018-02-13 DIAGNOSIS — J45909 Unspecified asthma, uncomplicated: ICD-10-CM

## 2018-02-13 DIAGNOSIS — G43909 Migraine, unspecified, not intractable, without status migrainosus: ICD-10-CM

## 2018-02-13 DIAGNOSIS — M5010 Cervical disc disorder with radiculopathy, unspecified cervical region: Principal | ICD-10-CM

## 2018-02-13 LAB — BASIC METABOLIC PANEL
Lab: 0.7 mg/dL (ref 0.4–1.00)
Lab: 105 MMOL/L (ref 98–110)
Lab: 12 mg/dL (ref 7–25)
Lab: 137 MMOL/L (ref 137–147)
Lab: 25 MMOL/L (ref 21–30)
Lab: 4 MMOL/L (ref 3.5–5.1)
Lab: 7 pg — ABNORMAL LOW (ref 3–12)
Lab: 9.3 mg/dL (ref 8.5–10.6)
Lab: 90 mg/dL (ref 70–100)
Lab: >60 mL/min (ref >60)
Lab: >60 mL/min (ref >60)

## 2018-02-13 LAB — CBC: Lab: 7.2 K/UL (ref 4.5–11.0)

## 2018-02-14 ENCOUNTER — Ambulatory Visit: Admit: 2018-02-13 | Discharge: 2018-02-13 | Payer: BC Managed Care – HMO

## 2018-02-14 DIAGNOSIS — Z01818 Encounter for other preprocedural examination: ICD-10-CM

## 2018-02-14 DIAGNOSIS — M7662 Achilles tendinitis, left leg: Principal | ICD-10-CM

## 2018-02-14 DIAGNOSIS — Z9889 Other specified postprocedural states: ICD-10-CM

## 2018-02-21 ENCOUNTER — Encounter: Admit: 2018-02-21 | Discharge: 2018-02-21 | Payer: BC Managed Care – HMO

## 2018-03-06 ENCOUNTER — Encounter: Admit: 2018-03-06 | Discharge: 2018-03-06 | Payer: BC Managed Care – HMO

## 2018-03-06 ENCOUNTER — Encounter: Admit: 2018-03-06 | Discharge: 2018-03-07 | Payer: Private Health Insurance - Indemnity

## 2018-03-06 DIAGNOSIS — T753XXA Motion sickness, initial encounter: ICD-10-CM

## 2018-03-06 DIAGNOSIS — M5010 Cervical disc disorder with radiculopathy, unspecified cervical region: Principal | ICD-10-CM

## 2018-03-06 DIAGNOSIS — M797 Fibromyalgia: ICD-10-CM

## 2018-03-06 DIAGNOSIS — R0902 Hypoxemia: ICD-10-CM

## 2018-03-06 DIAGNOSIS — G4730 Sleep apnea, unspecified: ICD-10-CM

## 2018-03-06 DIAGNOSIS — N200 Calculus of kidney: Principal | ICD-10-CM

## 2018-03-06 DIAGNOSIS — G43909 Migraine, unspecified, not intractable, without status migrainosus: ICD-10-CM

## 2018-03-06 DIAGNOSIS — R4189 Other symptoms and signs involving cognitive functions and awareness: ICD-10-CM

## 2018-03-06 DIAGNOSIS — J45909 Unspecified asthma, uncomplicated: ICD-10-CM

## 2018-03-06 MED ORDER — THROMBIN (BOVINE) 5,000 UNIT TP SOLR
0 refills | Status: DC
Start: 2018-03-06 — End: 2018-03-06
  Administered 2018-03-06: 17:00:00 5000 [IU] via TOPICAL

## 2018-03-06 MED ORDER — FENTANYL CITRATE (PF) 50 MCG/ML IJ SOLN
25-50 ug | INTRAVENOUS | 0 refills | Status: DC | PRN
Start: 2018-03-06 — End: 2018-03-07

## 2018-03-06 MED ORDER — PROPOFOL INJ 10 MG/ML IV VIAL
INTRAVENOUS | 0 refills | Status: DC
Start: 2018-03-06 — End: 2018-03-06
  Administered 2018-03-06: 16:00:00 150 mg via INTRAVENOUS

## 2018-03-06 MED ORDER — FENTANYL CITRATE (PF) 50 MCG/ML IJ SOLN
25 ug | INTRAVENOUS | 0 refills | Status: DC | PRN
Start: 2018-03-06 — End: 2018-03-06
  Administered 2018-03-06: 18:00:00 25 ug via INTRAVENOUS

## 2018-03-06 MED ORDER — LORATADINE 10 MG PO TAB
10 mg | Freq: Every evening | ORAL | 0 refills | Status: DC
Start: 2018-03-06 — End: 2018-03-07
  Administered 2018-03-07: 02:00:00 10 mg via ORAL

## 2018-03-06 MED ORDER — DOCUSATE SODIUM 100 MG PO CAP
100 mg | Freq: Two times a day (BID) | ORAL | 0 refills | Status: DC
Start: 2018-03-06 — End: 2018-03-07
  Administered 2018-03-07: 02:00:00 100 mg via ORAL

## 2018-03-06 MED ORDER — HALOPERIDOL LACTATE 5 MG/ML IJ SOLN
1 mg | Freq: Once | INTRAVENOUS | 0 refills | Status: DC | PRN
Start: 2018-03-06 — End: 2018-03-06

## 2018-03-06 MED ORDER — PROPOFOL 10 MG/ML IV EMUL (INFUSION)(AM)(OR)
INTRAVENOUS | 0 refills | Status: DC
Start: 2018-03-06 — End: 2018-03-06
  Administered 2018-03-06: 16:00:00 150 ug/kg/min via INTRAVENOUS

## 2018-03-06 MED ORDER — SODIUM CHLORIDE 0.9 % IV SOLP
1000 mL | INTRAVENOUS | 0 refills | Status: DC
Start: 2018-03-06 — End: 2018-03-07

## 2018-03-06 MED ORDER — MIDAZOLAM 1 MG/ML IJ SOLN
INTRAVENOUS | 0 refills | Status: DC
Start: 2018-03-06 — End: 2018-03-06
  Administered 2018-03-06 (×2): 1 mg via INTRAVENOUS

## 2018-03-06 MED ORDER — TRAMADOL 50 MG PO TAB
50-100 mg | ORAL | 0 refills | Status: DC | PRN
Start: 2018-03-06 — End: 2018-03-07
  Administered 2018-03-06 – 2018-03-07 (×6): 50 mg via ORAL

## 2018-03-06 MED ORDER — FENTANYL CITRATE (PF) 50 MCG/ML IJ SOLN
0 refills | Status: DC
Start: 2018-03-06 — End: 2018-03-06
  Administered 2018-03-06: 16:00:00 50 ug via INTRAVENOUS
  Administered 2018-03-06: 16:00:00 100 ug via INTRAVENOUS

## 2018-03-06 MED ORDER — BISACODYL 10 MG RE SUPP
10 mg | Freq: Every day | RECTAL | 0 refills | Status: DC
Start: 2018-03-06 — End: 2018-03-07

## 2018-03-06 MED ORDER — ALBUTEROL SULFATE 90 MCG/ACTUATION IN HFAA
1-2 | RESPIRATORY_TRACT | 0 refills | Status: DC | PRN
Start: 2018-03-06 — End: 2018-03-07

## 2018-03-06 MED ORDER — ONDANSETRON HCL (PF) 4 MG/2 ML IJ SOLN
INTRAVENOUS | 0 refills | Status: DC
Start: 2018-03-06 — End: 2018-03-06

## 2018-03-06 MED ORDER — LIDOCAINE (PF) 200 MG/10 ML (2 %) IJ SYRG
0 refills | Status: DC
Start: 2018-03-06 — End: 2018-03-06
  Administered 2018-03-06: 16:00:00 80 mg via INTRAVENOUS

## 2018-03-06 MED ORDER — LIDOCAINE (PF) 10 MG/ML (1 %) IJ SOLN
.1-2 mL | INTRAMUSCULAR | 0 refills | Status: DC | PRN
Start: 2018-03-06 — End: 2018-03-07

## 2018-03-06 MED ORDER — CEFAZOLIN INJ 1GM IVP
2 g | INTRAVENOUS | 0 refills | Status: CP
Start: 2018-03-06 — End: ?
  Administered 2018-03-07 (×3): 2 g via INTRAVENOUS

## 2018-03-06 MED ORDER — DEXAMETHASONE SODIUM PHOSPHATE 4 MG/ML IJ SOLN
INTRAVENOUS | 0 refills | Status: DC
Start: 2018-03-06 — End: 2018-03-06
  Administered 2018-03-06: 16:00:00 4 mg via INTRAVENOUS

## 2018-03-06 MED ORDER — FENTANYL CITRATE (PF) 50 MCG/ML IJ SOLN
50 ug | INTRAVENOUS | 0 refills | Status: DC | PRN
Start: 2018-03-06 — End: 2018-03-06

## 2018-03-06 MED ORDER — PHENYLEPHRINE IN 0.9% NACL(PF) 1 MG/10 ML (100 MCG/ML) IV SYRG
INTRAVENOUS | 0 refills | Status: DC
Start: 2018-03-06 — End: 2018-03-06
  Administered 2018-03-06: 16:00:00 100 ug via INTRAVENOUS
  Administered 2018-03-06 (×2): 150 ug via INTRAVENOUS

## 2018-03-06 MED ORDER — OXYCODONE 5 MG PO TAB
5-10 mg | ORAL | 0 refills | Status: DC | PRN
Start: 2018-03-06 — End: 2018-03-07

## 2018-03-06 MED ORDER — CEFAZOLIN INJ 1GM IVP
2 g | Freq: Once | INTRAVENOUS | 0 refills | Status: CP
Start: 2018-03-06 — End: ?
  Administered 2018-03-06: 16:00:00 2 g via INTRAVENOUS

## 2018-03-06 MED ORDER — MAGNESIUM HYDROXIDE 2,400 MG/10 ML PO SUSP
10 mL | Freq: Every day | ORAL | 0 refills | Status: DC
Start: 2018-03-06 — End: 2018-03-07

## 2018-03-06 MED ORDER — ONDANSETRON HCL (PF) 4 MG/2 ML IJ SOLN
4 mg | INTRAVENOUS | 0 refills | Status: DC | PRN
Start: 2018-03-06 — End: 2018-03-07

## 2018-03-06 MED ORDER — ACETAMINOPHEN 325 MG PO TAB
650 mg | ORAL | 0 refills | Status: DC | PRN
Start: 2018-03-06 — End: 2018-03-07

## 2018-03-06 MED ORDER — BACITRACIN 50,000 UN NS 500 ML IRR BOT (OR)
0 refills | Status: DC
Start: 2018-03-06 — End: 2018-03-06
  Administered 2018-03-06 (×2): 500 mL

## 2018-03-06 MED ORDER — ACETAMINOPHEN 500 MG PO TAB
1000 mg | Freq: Once | ORAL | 0 refills | Status: CP
Start: 2018-03-06 — End: ?
  Administered 2018-03-06: 19:00:00 1000 mg via ORAL

## 2018-03-06 MED ORDER — SUGAMMADEX 100 MG/ML IV SOLN
INTRAVENOUS | 0 refills | Status: DC
Start: 2018-03-06 — End: 2018-03-06
  Administered 2018-03-06: 18:00:00 160 mg via INTRAVENOUS

## 2018-03-06 MED ORDER — FLUOXETINE 20 MG PO CAP
20 mg | Freq: Every day | ORAL | 0 refills | Status: DC
Start: 2018-03-06 — End: 2018-03-07

## 2018-03-06 MED ORDER — SENNOSIDES-DOCUSATE SODIUM 8.6-50 MG PO TAB
1 | Freq: Two times a day (BID) | ORAL | 0 refills | Status: DC
Start: 2018-03-06 — End: 2018-03-07
  Administered 2018-03-07: 02:00:00 1 via ORAL

## 2018-03-06 MED ORDER — BACLOFEN 10 MG PO TAB
5 mg | Freq: Three times a day (TID) | ORAL | 0 refills | Status: DC
Start: 2018-03-06 — End: 2018-03-07
  Administered 2018-03-06 – 2018-03-07 (×2): 5 mg via ORAL

## 2018-03-06 MED ORDER — NORTRIPTYLINE 25 MG PO CAP
25 mg | Freq: Every evening | ORAL | 0 refills | Status: DC
Start: 2018-03-06 — End: 2018-03-07
  Administered 2018-03-07: 02:00:00 25 mg via ORAL

## 2018-03-06 MED ORDER — DEXTRAN 70-HYPROMELLOSE (PF) 0.1-0.3 % OP DPET
0 refills | Status: DC
Start: 2018-03-06 — End: 2018-03-06
  Administered 2018-03-06: 16:00:00 2 [drp] via OPHTHALMIC

## 2018-03-06 MED ORDER — HYDROMORPHONE (PF) 2 MG/ML IJ SYRG
.5-1 mg | INTRAVENOUS | 0 refills | Status: DC | PRN
Start: 2018-03-06 — End: 2018-03-06

## 2018-03-06 MED ORDER — BACLOFEN 10 MG PO TAB
10 mg | Freq: Every evening | ORAL | 0 refills | Status: DC
Start: 2018-03-06 — End: 2018-03-07
  Administered 2018-03-07: 02:00:00 10 mg via ORAL

## 2018-03-06 MED ORDER — HYDROMORPHONE (PF) 2 MG/ML IJ SYRG
0 refills | Status: DC
Start: 2018-03-06 — End: 2018-03-06
  Administered 2018-03-06: 18:00:00 .4 mg via INTRAVENOUS

## 2018-03-06 MED ORDER — ROCURONIUM 10 MG/ML IV SOLN
INTRAVENOUS | 0 refills | Status: DC
Start: 2018-03-06 — End: 2018-03-06
  Administered 2018-03-06: 16:00:00 50 mg via INTRAVENOUS

## 2018-03-06 MED ORDER — LORATADINE 10 MG PO TAB
5 mg | ORAL | 0 refills | Status: DC | PRN
Start: 2018-03-06 — End: 2018-03-06

## 2018-03-06 MED ORDER — BACLOFEN 10 MG PO TAB
5 mg | Freq: Three times a day (TID) | ORAL | 0 refills | Status: DC
Start: 2018-03-06 — End: 2018-03-06

## 2018-03-06 MED ORDER — CHOLECALCIFEROL (VITAMIN D3) 25 MCG (1,000 UNIT) PO TAB
1000 [IU] | Freq: Every day | ORAL | 0 refills | Status: DC
Start: 2018-03-06 — End: 2018-03-07
  Administered 2018-03-06 – 2018-03-07 (×2): 1000 [IU] via ORAL

## 2018-03-06 MED ORDER — REMIFENTANYL 1000MCG IN NS 20ML (OR)
INTRAVENOUS | 0 refills | Status: DC
Start: 2018-03-06 — End: 2018-03-06
  Administered 2018-03-06 (×2): .1 ug/kg/min via INTRAVENOUS

## 2018-03-06 MED ORDER — DIAZEPAM 5 MG PO TAB
2.5 mg | ORAL | 0 refills | Status: DC | PRN
Start: 2018-03-06 — End: 2018-03-07

## 2018-03-06 MED ADMIN — SODIUM CHLORIDE 0.9 % IV SOLP [27838]: 1000 mL | INTRAVENOUS | @ 15:00:00 | Stop: 2018-03-06 | NDC 00338004904

## 2018-03-07 ENCOUNTER — Encounter: Admit: 2018-02-13 | Discharge: 2018-02-13 | Payer: BC Managed Care – HMO

## 2018-03-07 ENCOUNTER — Encounter: Admit: 2018-03-07 | Discharge: 2018-03-07 | Payer: BC Managed Care – HMO

## 2018-03-07 ENCOUNTER — Encounter: Admit: 2018-03-06 | Discharge: 2018-03-06 | Payer: BC Managed Care – HMO

## 2018-03-07 DIAGNOSIS — Z823 Family history of stroke: ICD-10-CM

## 2018-03-07 DIAGNOSIS — M797 Fibromyalgia: ICD-10-CM

## 2018-03-07 DIAGNOSIS — Z90710 Acquired absence of both cervix and uterus: ICD-10-CM

## 2018-03-07 DIAGNOSIS — M5020 Other cervical disc displacement, unspecified cervical region: ICD-10-CM

## 2018-03-07 DIAGNOSIS — Z833 Family history of diabetes mellitus: ICD-10-CM

## 2018-03-07 DIAGNOSIS — Z87442 Personal history of urinary calculi: ICD-10-CM

## 2018-03-07 DIAGNOSIS — G4730 Sleep apnea, unspecified: ICD-10-CM

## 2018-03-07 DIAGNOSIS — Z01818 Encounter for other preprocedural examination: ICD-10-CM

## 2018-03-07 DIAGNOSIS — Z8249 Family history of ischemic heart disease and other diseases of the circulatory system: ICD-10-CM

## 2018-03-07 DIAGNOSIS — M5010 Cervical disc disorder with radiculopathy, unspecified cervical region: Principal | ICD-10-CM

## 2018-03-07 MED ORDER — TRAMADOL 50 MG PO TAB
50 mg | ORAL_TABLET | ORAL | 0 refills | Status: AC | PRN
Start: 2018-03-07 — End: 2018-03-20
  Filled 2018-03-07 (×2): qty 50, 9d supply, fill #1

## 2018-03-09 ENCOUNTER — Encounter: Admit: 2018-03-09 | Discharge: 2018-03-09 | Payer: BC Managed Care – HMO

## 2018-03-09 DIAGNOSIS — G4730 Sleep apnea, unspecified: ICD-10-CM

## 2018-03-09 DIAGNOSIS — M797 Fibromyalgia: ICD-10-CM

## 2018-03-09 DIAGNOSIS — J45909 Unspecified asthma, uncomplicated: ICD-10-CM

## 2018-03-09 DIAGNOSIS — N200 Calculus of kidney: Principal | ICD-10-CM

## 2018-03-09 DIAGNOSIS — T753XXA Motion sickness, initial encounter: ICD-10-CM

## 2018-03-09 DIAGNOSIS — G43909 Migraine, unspecified, not intractable, without status migrainosus: ICD-10-CM

## 2018-03-09 DIAGNOSIS — R4189 Other symptoms and signs involving cognitive functions and awareness: ICD-10-CM

## 2018-03-09 DIAGNOSIS — R0902 Hypoxemia: ICD-10-CM

## 2018-03-20 ENCOUNTER — Ambulatory Visit: Admit: 2018-03-20 | Discharge: 2018-03-21 | Payer: BC Managed Care – HMO

## 2018-03-20 ENCOUNTER — Encounter: Admit: 2018-03-20 | Discharge: 2018-03-20 | Payer: BC Managed Care – HMO

## 2018-03-20 DIAGNOSIS — J45909 Unspecified asthma, uncomplicated: Secondary | ICD-10-CM

## 2018-03-20 DIAGNOSIS — R0902 Hypoxemia: Secondary | ICD-10-CM

## 2018-03-20 DIAGNOSIS — M797 Fibromyalgia: Secondary | ICD-10-CM

## 2018-03-20 DIAGNOSIS — G43909 Migraine, unspecified, not intractable, without status migrainosus: Secondary | ICD-10-CM

## 2018-03-20 DIAGNOSIS — G4730 Sleep apnea, unspecified: Secondary | ICD-10-CM

## 2018-03-20 DIAGNOSIS — N200 Calculus of kidney: Secondary | ICD-10-CM

## 2018-03-20 DIAGNOSIS — T753XXA Motion sickness, initial encounter: Secondary | ICD-10-CM

## 2018-03-20 DIAGNOSIS — Z5189 Encounter for other specified aftercare: ICD-10-CM

## 2018-03-20 DIAGNOSIS — R4189 Other symptoms and signs involving cognitive functions and awareness: Secondary | ICD-10-CM

## 2018-03-20 MED ORDER — CEPHALEXIN 500 MG PO CAP
500 mg | ORAL_CAPSULE | Freq: Four times a day (QID) | ORAL | 0 refills | Status: AC
Start: 2018-03-20 — End: 2018-04-04

## 2018-03-20 MED ORDER — GABAPENTIN 100 MG PO CAP
100 mg | ORAL_CAPSULE | Freq: Two times a day (BID) | ORAL | 0 refills | Status: AC
Start: 2018-03-20 — End: 2018-04-04

## 2018-03-20 MED ORDER — TRAMADOL 50 MG PO TAB
50 mg | ORAL_TABLET | ORAL | 0 refills | Status: AC | PRN
Start: 2018-03-20 — End: 2018-03-31

## 2018-03-21 ENCOUNTER — Ambulatory Visit: Admit: 2018-03-20 | Discharge: 2018-03-20 | Payer: BC Managed Care – HMO

## 2018-03-21 DIAGNOSIS — M5020 Other cervical disc displacement, unspecified cervical region: Secondary | ICD-10-CM

## 2018-03-24 ENCOUNTER — Encounter: Admit: 2018-03-24 | Discharge: 2018-03-24 | Payer: BC Managed Care – HMO

## 2018-03-31 ENCOUNTER — Encounter: Admit: 2018-03-31 | Discharge: 2018-03-31 | Payer: BC Managed Care

## 2018-03-31 DIAGNOSIS — M5010 Cervical disc disorder with radiculopathy, unspecified cervical region: Secondary | ICD-10-CM

## 2018-03-31 MED ORDER — TRAMADOL 50 MG PO TAB
50 mg | ORAL_TABLET | ORAL | 0 refills | Status: AC | PRN
Start: 2018-03-31 — End: 2018-04-04

## 2018-04-04 ENCOUNTER — Ambulatory Visit: Admit: 2018-04-04 | Discharge: 2018-04-05 | Payer: BC Managed Care – HMO

## 2018-04-04 ENCOUNTER — Encounter: Admit: 2018-04-04 | Discharge: 2018-04-04 | Payer: BC Managed Care – HMO

## 2018-04-04 DIAGNOSIS — R0902 Hypoxemia: Secondary | ICD-10-CM

## 2018-04-04 DIAGNOSIS — N200 Calculus of kidney: Secondary | ICD-10-CM

## 2018-04-04 DIAGNOSIS — G4730 Sleep apnea, unspecified: Secondary | ICD-10-CM

## 2018-04-04 DIAGNOSIS — R4189 Other symptoms and signs involving cognitive functions and awareness: Secondary | ICD-10-CM

## 2018-04-04 DIAGNOSIS — M797 Fibromyalgia: Secondary | ICD-10-CM

## 2018-04-04 DIAGNOSIS — T753XXA Motion sickness, initial encounter: Secondary | ICD-10-CM

## 2018-04-04 DIAGNOSIS — G43909 Migraine, unspecified, not intractable, without status migrainosus: Secondary | ICD-10-CM

## 2018-04-04 DIAGNOSIS — J45909 Unspecified asthma, uncomplicated: Secondary | ICD-10-CM

## 2018-04-04 DIAGNOSIS — M5010 Cervical disc disorder with radiculopathy, unspecified cervical region: Principal | ICD-10-CM

## 2018-04-04 MED ORDER — TRAMADOL 50 MG PO TAB
50 mg | ORAL_TABLET | ORAL | 1 refills | Status: AC | PRN
Start: 2018-04-04 — End: ?

## 2018-04-04 MED ORDER — GABAPENTIN 100 MG PO CAP
300 mg | ORAL_CAPSULE | Freq: Two times a day (BID) | ORAL | 3 refills | Status: AC
Start: 2018-04-04 — End: 2018-04-12

## 2018-04-12 ENCOUNTER — Encounter: Admit: 2018-04-12 | Discharge: 2018-04-12 | Payer: BC Managed Care – HMO

## 2018-04-12 MED ORDER — GABAPENTIN 300 MG PO CAP
300 mg | ORAL_CAPSULE | Freq: Two times a day (BID) | ORAL | 3 refills | Status: AC
Start: 2018-04-12 — End: 2018-05-05

## 2018-05-05 ENCOUNTER — Encounter: Admit: 2018-05-05 | Discharge: 2018-05-05 | Payer: BC Managed Care – HMO

## 2018-05-05 MED ORDER — GABAPENTIN 300 MG PO CAP
ORAL_CAPSULE | Freq: Two times a day (BID) | 2 refills | Status: AC
Start: 2018-05-05 — End: ?

## 2018-05-23 ENCOUNTER — Ambulatory Visit: Admit: 2018-05-23 | Discharge: 2018-05-23 | Payer: BC Managed Care – HMO

## 2018-05-23 ENCOUNTER — Encounter: Admit: 2018-05-23 | Discharge: 2018-05-23 | Payer: BC Managed Care – HMO

## 2018-05-23 DIAGNOSIS — M5010 Cervical disc disorder with radiculopathy, unspecified cervical region: Principal | ICD-10-CM

## 2018-05-24 ENCOUNTER — Encounter: Admit: 2018-05-24 | Discharge: 2018-05-24 | Payer: BC Managed Care – HMO

## 2018-05-24 DIAGNOSIS — R4189 Other symptoms and signs involving cognitive functions and awareness: ICD-10-CM

## 2018-05-24 DIAGNOSIS — G4730 Sleep apnea, unspecified: ICD-10-CM

## 2018-05-24 DIAGNOSIS — N200 Calculus of kidney: Principal | ICD-10-CM

## 2018-05-24 DIAGNOSIS — M797 Fibromyalgia: ICD-10-CM

## 2018-05-24 DIAGNOSIS — R0902 Hypoxemia: ICD-10-CM

## 2018-05-24 DIAGNOSIS — J45909 Unspecified asthma, uncomplicated: ICD-10-CM

## 2018-05-24 DIAGNOSIS — G43909 Migraine, unspecified, not intractable, without status migrainosus: ICD-10-CM

## 2018-05-24 DIAGNOSIS — T753XXA Motion sickness, initial encounter: ICD-10-CM

## 2018-06-02 ENCOUNTER — Ambulatory Visit: Admit: 2018-06-02 | Discharge: 2018-06-02 | Payer: BC Managed Care – HMO

## 2018-06-02 ENCOUNTER — Encounter: Admit: 2018-06-02 | Discharge: 2018-06-02 | Payer: BC Managed Care – HMO

## 2018-06-02 DIAGNOSIS — M5010 Cervical disc disorder with radiculopathy, unspecified cervical region: Principal | ICD-10-CM

## 2018-06-12 ENCOUNTER — Encounter: Admit: 2018-06-12 | Discharge: 2018-06-12 | Payer: BC Managed Care – HMO

## 2019-09-24 ENCOUNTER — Encounter: Admit: 2019-09-24 | Discharge: 2019-09-24 | Payer: BC Managed Care – HMO

## 2019-10-18 ENCOUNTER — Encounter: Admit: 2019-10-18 | Discharge: 2019-10-18 | Payer: BC Managed Care – HMO

## 2019-10-18 DIAGNOSIS — M5010 Cervical disc disorder with radiculopathy, unspecified cervical region: Secondary | ICD-10-CM

## 2019-10-18 DIAGNOSIS — M5020 Other cervical disc displacement, unspecified cervical region: Secondary | ICD-10-CM

## 2019-10-24 ENCOUNTER — Encounter: Admit: 2019-10-24 | Discharge: 2019-10-24 | Payer: BC Managed Care – HMO

## 2019-10-24 ENCOUNTER — Ambulatory Visit: Admit: 2019-10-24 | Discharge: 2019-10-24 | Payer: BC Managed Care – HMO

## 2019-10-24 DIAGNOSIS — M5010 Cervical disc disorder with radiculopathy, unspecified cervical region: Secondary | ICD-10-CM

## 2019-10-24 DIAGNOSIS — R55 Syncope and collapse: Secondary | ICD-10-CM

## 2019-10-24 DIAGNOSIS — M5020 Other cervical disc displacement, unspecified cervical region: Secondary | ICD-10-CM

## 2019-10-24 DIAGNOSIS — G43909 Migraine, unspecified, not intractable, without status migrainosus: Secondary | ICD-10-CM

## 2019-10-24 DIAGNOSIS — M797 Fibromyalgia: Secondary | ICD-10-CM

## 2019-10-24 DIAGNOSIS — R4189 Other symptoms and signs involving cognitive functions and awareness: Secondary | ICD-10-CM

## 2019-10-24 DIAGNOSIS — N200 Calculus of kidney: Secondary | ICD-10-CM

## 2019-10-24 DIAGNOSIS — M25512 Pain in left shoulder: Secondary | ICD-10-CM

## 2019-10-24 DIAGNOSIS — T753XXA Motion sickness, initial encounter: Secondary | ICD-10-CM

## 2019-10-24 DIAGNOSIS — G4730 Sleep apnea, unspecified: Secondary | ICD-10-CM

## 2019-10-24 DIAGNOSIS — M546 Pain in thoracic spine: Secondary | ICD-10-CM

## 2019-10-24 DIAGNOSIS — R0902 Hypoxemia: Secondary | ICD-10-CM

## 2019-10-24 DIAGNOSIS — J45909 Unspecified asthma, uncomplicated: Secondary | ICD-10-CM

## 2019-10-24 NOTE — Progress Notes
Subjective:       History of Present Illness  Ashley Gaines is a 54 y.o. female.  She presents with a chief complaint of midthoracic pain and left shoulder pain.  This has been going on for 18+ months and is worse with activity.  She has failed nonoperative management physical therapy, massage, and pain medications.  She does have a history of C6-7 ACDF by Dr. Stevan Born in December 2019.  She cannot identify any mitigating factors.  She also complains of syncopal episodes for which she is currently being worked up by a cardiologist.  However, she states that she thinks cardiologist stated that the syncopal episodes are not cardiac related.             Objective:         ? albuterol sulfate (PROAIR HFA) 90 mcg/actuation aerosol inhaler Inhale 1-2 puffs by mouth into the lungs every 6 hours as needed.   ? ascorbic acid (VITAMIN C) 500 mg tablet Take 1 tablet by mouth twice daily.   ? baclofen (LIORESAL) 10 mg tablet 5mg  three times daily and 10 mg at bedtime   ? cholecalciferol (VITAMIN D-3) 1,000 units tablet Take 1,000 Units by mouth daily.   ? docusate (COLACE) 100 mg capsule Take one capsule by mouth twice daily as needed for Constipation.   ? estradiol cypionate(+) (DEPO-ESTRADIOL) 5 mg/mL oil Inject 0.4 mg into the muscle. Every 19 days    ? fexofenadine(+) (ALLEGRA) 180 mg tablet Take 180 mg by mouth at bedtime daily.   ? fluoxetine (PROZAC) 20 mg capsule Take 20 mg by mouth daily.   ? gabapentin (NEURONTIN) 300 mg capsule TAKE 1 CAPSULE BY MOUTH TWICE A DAY   ? Magnesium 200 mg tab Take 1 tablet by mouth daily.   ? MYRBETRIQ 50 mg tablet Take 50 mg by mouth daily.   ? nortriptyline (PAMELOR) 25 mg capsule Take 25 mg by mouth at bedtime daily.   ? omeprazole DR (PRILOSEC) 20 mg capsule Take 20 mg by mouth twice daily.   ? other medication [employer] Multivitamin 1 tablet by mouth twice daily   ? traMADol (ULTRAM) 50 mg tablet Take one tablet by mouth every 4 hours as needed.   ? zinc sulfate 220 mg (50 mg elemental zinc) capsule Take 220 mg by mouth daily.     Vitals:    10/24/19 1306   Weight: 72.6 kg (160 lb)   Height: 157.5 cm (62)   PainSc: Eight     Body mass index is 29.26 kg/m?Marland Kitchen     Physical Exam  Vitals and nursing note reviewed.   Constitutional:       Appearance: Normal appearance.   HENT:      Head: Normocephalic and atraumatic.   Pulmonary:      Effort: Pulmonary effort is normal.   Musculoskeletal:      Left shoulder: Tenderness present.      Cervical back: Normal.      Thoracic back: Tenderness present.      Lumbar back: Normal.   Skin:     General: Skin is warm and dry.   Neurological:      General: No focal deficit present.      Mental Status: She is alert and oriented to person, place, and time.      Sensory: Sensation is intact.      Motor: Motor function is intact.      Gait: Gait is intact.  Deep Tendon Reflexes: Reflexes are normal and symmetric.   Psychiatric:         Mood and Affect: Mood normal.         Behavior: Behavior normal.     I independently reviewed recent MRI of the cervical spine which demonstrates prior C6-7 ACDF without any sign of significant stenosis or nerve root impingement.  I also independently reviewed cervical films performed in clinic today which demonstrate good, stable placement of hardware with solid bony fusion.  There is no abnormal motion on flexion/extension views.         Assessment and Plan:  I do not think surgical intervention is indicated.  I am referring this patient to pain management for her mid thoracic and left shoulder pain.  I am also placing referral to neurology for syncopal work-up.  I will see her back on an as-needed basis.

## 2019-11-07 ENCOUNTER — Encounter: Admit: 2019-11-07 | Discharge: 2019-11-07 | Payer: BC Managed Care – HMO

## 2019-11-12 IMAGING — MG MAMMOGRAM 3D SCREEN, BILATERAL
10 of 16 series · 10 of 16 positions shown · non-contrast
Comparison: none

[R CC (1 of 2)]
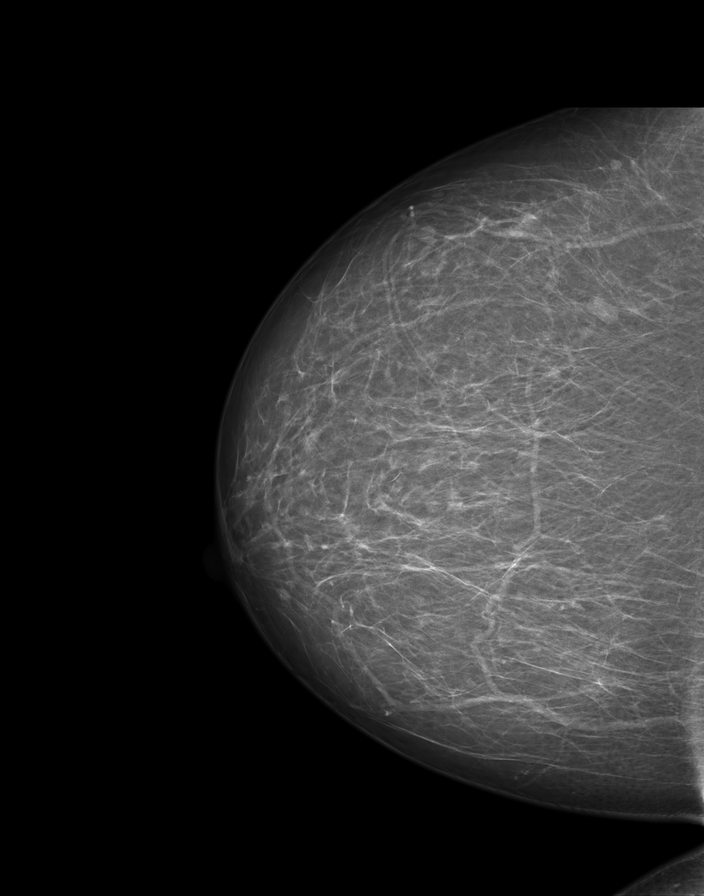

[R tomo (1 of 2)]
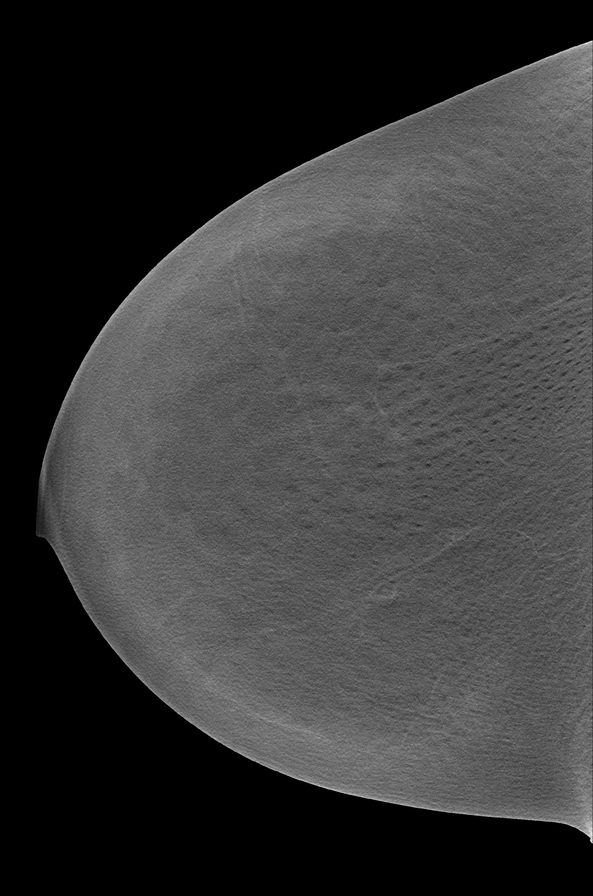

[R CC (2 of 2)]
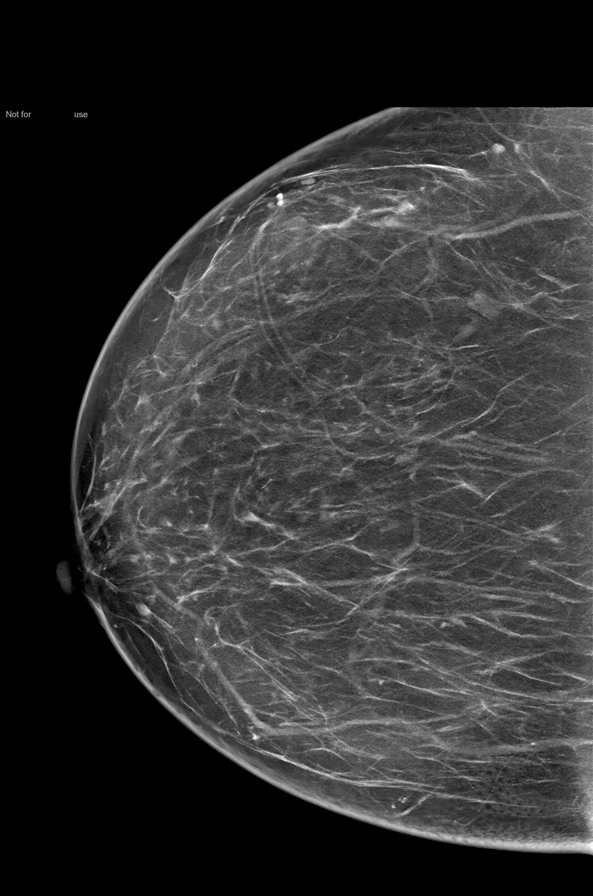

[R]
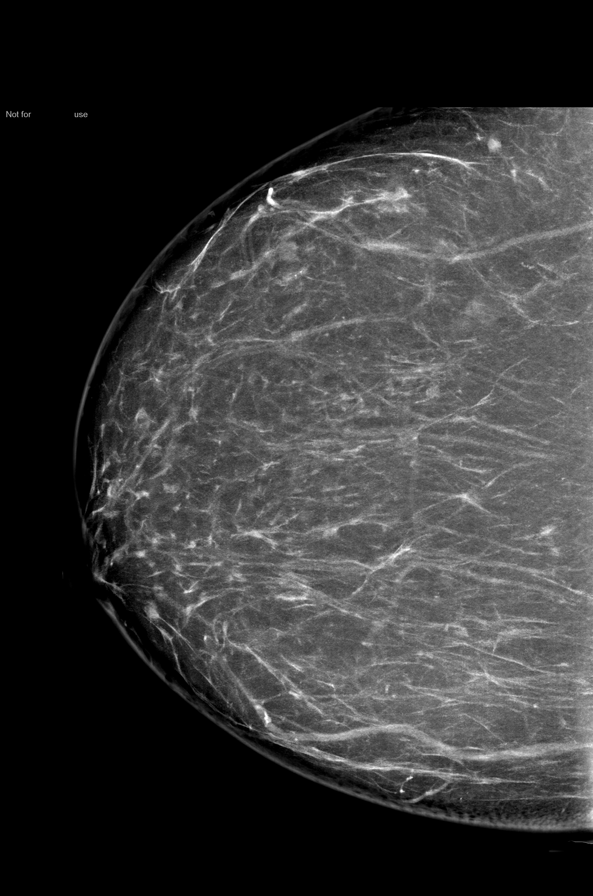

[L CC (1 of 2)]
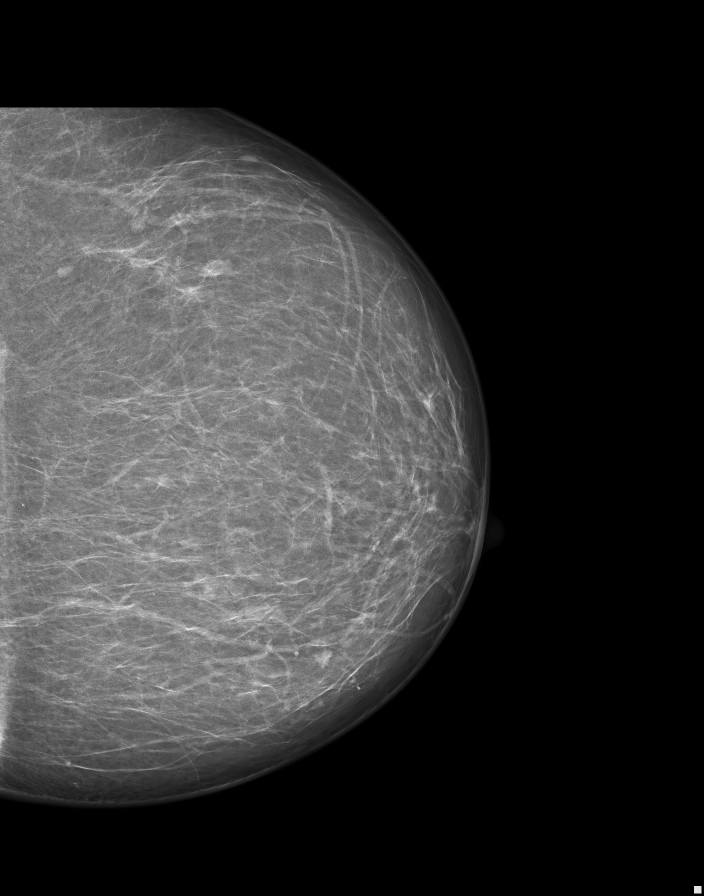

[L tomo]
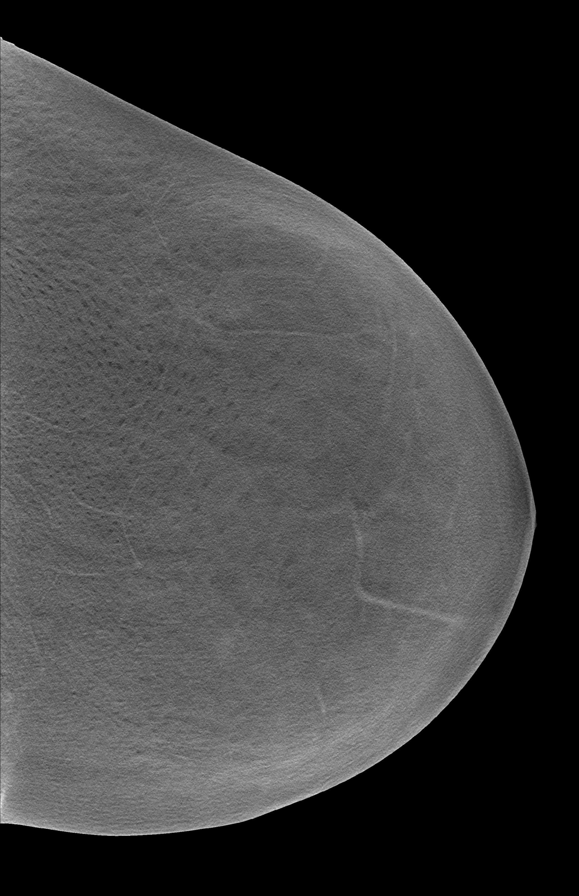

[L CC (2 of 2)]
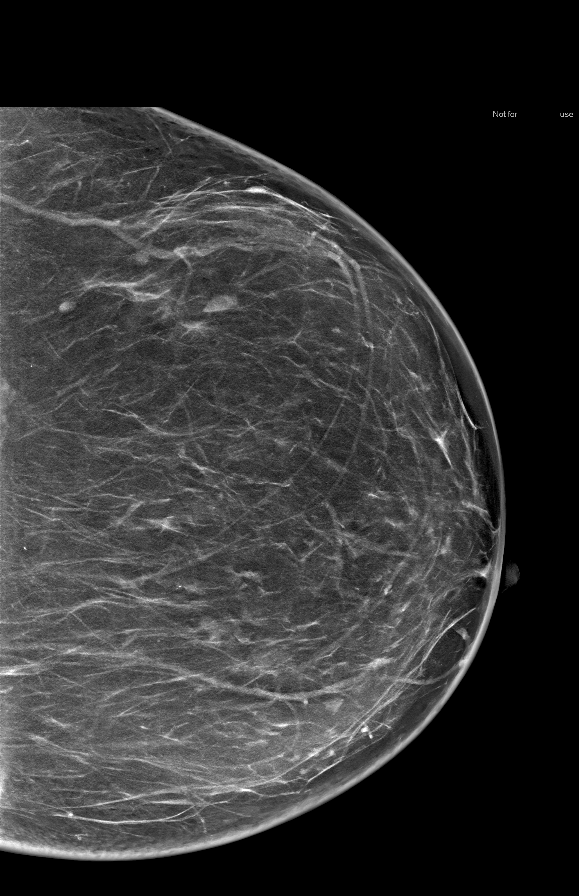

[L]
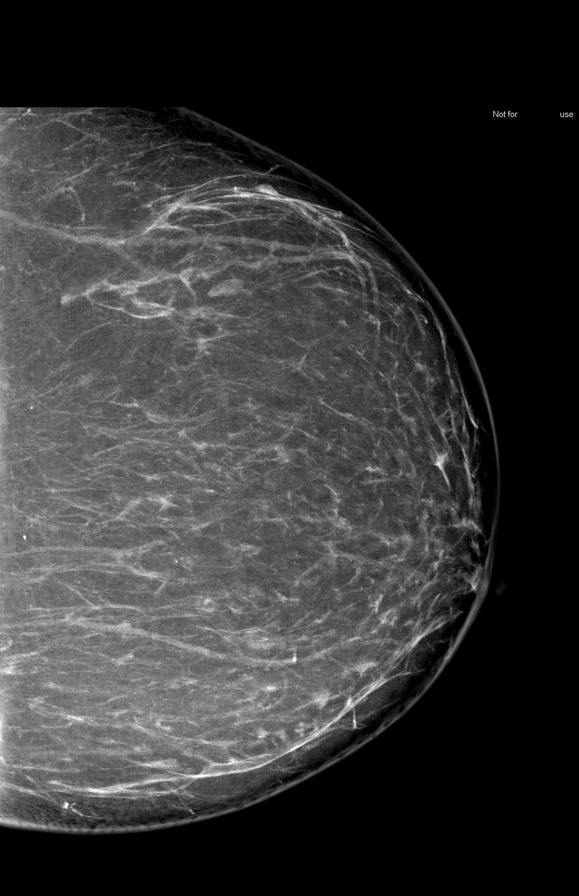

[R MLO]
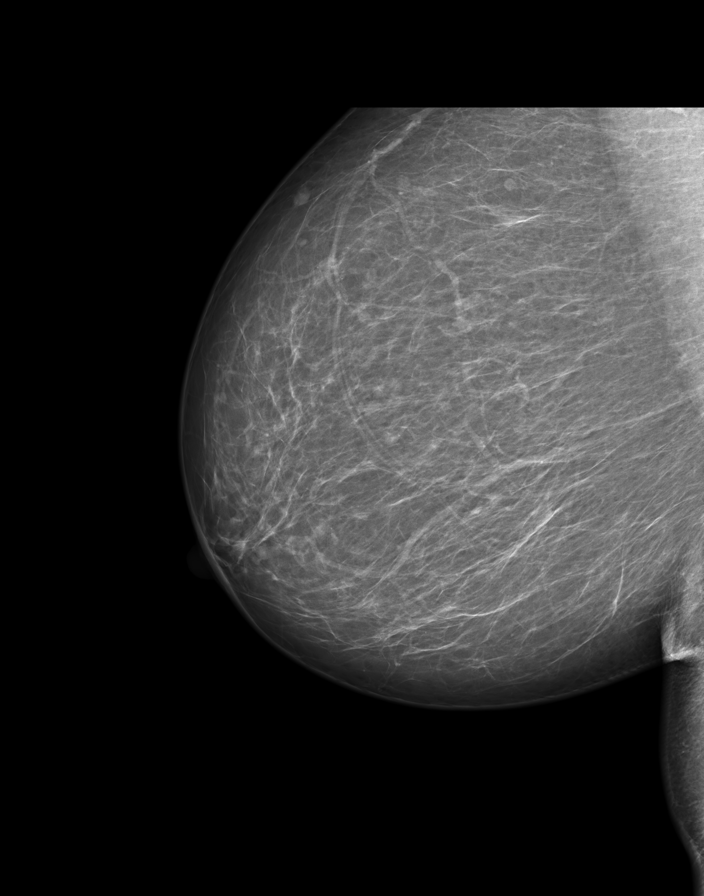

[R tomo (2 of 2)]
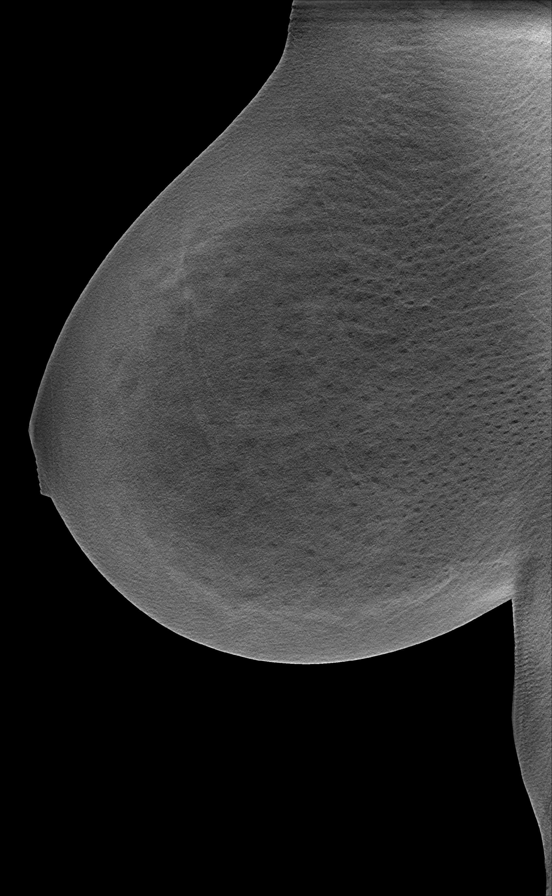

[10 of 16 positions shown; findings below may reference images not displayed]

EXAM

3D SCREENING MAMMOGRAM, BILATERAL

INDICATION

SCREENING MAMM
SCR. 3D.HX AZAR CYST ASP. MATERNAL AUNT WITH CANCER. LM

TECHNIQUE

Digital 2D CC and MLO projections obtained with 3D tomographic views per manufacturer's protocol.
ICAD version 7.2 was used during this exam.

COMPARISONS

March 12, 2016

FINDINGS

Stable scattered nodular densities are noted throughout both breasts. No concerning changes seen.

IMPRESSION

Stable bilateral mammography. One year follow-up is recommended.

BI-RADS code 2-benign

## 2019-11-15 NOTE — Patient Instructions
Dr. Southwell and his staff appreciate the opportunity to care for you today. A few items to assist you with what comes next:     *If lab work or imaging was ordered, I will be in contact with you only if the results are abnormal enough that Dr. Southwell requests for your to be notified. If needed, adjustments to the plan will be made.  You are very welcome to contact me to discuss results sooner if you'd prefer.     *All testing and lab work be done here at [facility] unless insurance requires an alternative.  If any part of your care such as a referral doesn't seem to be getting scheduled within a week, please contact our office so we can assist you.     *Dr. Southwell has asked that you be scheduled for a follow up appointment in a specific amount of time to go over all results with him.  If all testing has not been completed by your scheduled follow up appointment, we will need to reschedule this appointment.  At your follow up appointment he will explain results, continue neurological treatment or discharge you back to your referring physician.      Feel free to call or message me through my chart.  I'm here to assist you between appointments and can discuss concerns with Dr. Southwell as needed.      Changes in appointment and wait list requests can be made by calling our scheduling line at [phone].     Our goal is to provide you with the very best patient care. If we have not met this expectation, please call me to discuss as we appreciate opportunities for improvement.  We are very glad to have met you today and hope you were pleased with the care you received.     Information to contact nursing staff for Dr. Southwell  [phone]  [phone] fax  [phone] scheduling  Send message by mychart

## 2019-11-20 ENCOUNTER — Encounter: Admit: 2019-11-20 | Discharge: 2019-11-20 | Payer: BC Managed Care – HMO

## 2019-11-20 ENCOUNTER — Ambulatory Visit: Admit: 2019-11-20 | Discharge: 2019-11-20 | Payer: BC Managed Care – HMO

## 2019-11-20 DIAGNOSIS — M47812 Spondylosis without myelopathy or radiculopathy, cervical region: Secondary | ICD-10-CM

## 2019-11-20 DIAGNOSIS — R0902 Hypoxemia: Secondary | ICD-10-CM

## 2019-11-20 DIAGNOSIS — N200 Calculus of kidney: Secondary | ICD-10-CM

## 2019-11-20 DIAGNOSIS — J45909 Unspecified asthma, uncomplicated: Secondary | ICD-10-CM

## 2019-11-20 DIAGNOSIS — M7918 Myalgia, other site: Secondary | ICD-10-CM

## 2019-11-20 DIAGNOSIS — M5412 Radiculopathy, cervical region: Secondary | ICD-10-CM

## 2019-11-20 DIAGNOSIS — R55 Syncope and collapse: Secondary | ICD-10-CM

## 2019-11-20 DIAGNOSIS — T753XXA Motion sickness, initial encounter: Secondary | ICD-10-CM

## 2019-11-20 DIAGNOSIS — R4189 Other symptoms and signs involving cognitive functions and awareness: Secondary | ICD-10-CM

## 2019-11-20 DIAGNOSIS — G4730 Sleep apnea, unspecified: Secondary | ICD-10-CM

## 2019-11-20 DIAGNOSIS — G43909 Migraine, unspecified, not intractable, without status migrainosus: Secondary | ICD-10-CM

## 2019-11-20 DIAGNOSIS — M797 Fibromyalgia: Secondary | ICD-10-CM

## 2019-11-20 MED ORDER — METHOCARBAMOL 750 MG PO TAB
750 mg | ORAL_TABLET | Freq: Three times a day (TID) | ORAL | 2 refills | Status: AC | PRN
Start: 2019-11-20 — End: ?

## 2019-11-20 NOTE — Patient Instructions
It was nice to see you today. Thank you for choosing to visit our clinic.       Your time is important and if you had to wait today, we do apologize. Our goal is to run exactly on time; however, on occasion, we get behind in clinic due to unexpected patient issues. Thank you for your patience.    General Instructions:  ? How to reach me: Please send a MyChart message to the [facility] or leave a voicemail for my nurse, Melissa, at [phone]  ? How to get a medication refill: Please use the MyChart Refill request or contact your pharmacy directly to request medication refills. We do not do same day refills on controlled substances.  ? How to receive your test results: If you have signed up for MyChart, you will receive your test results and messages from me this way. Otherwise, you will get a phone call or letter. If you are expecting results and have not heard from my office within 2 weeks of your testing, please send a MyChart message or call my office.  ? Scheduling: Our scheduling phone number is [phone].  ? Appointment Reminders on your cell phone: Make sure we have your cell phone number, and Text [place] to [phone].  ? Support for many chronic illnesses is available through [facility]: [facility] or [phone].  ? For questions on nights, weekends or holidays, call the Operator at [phone], and ask for the doctor on call for [facility].      Again, thank you for coming in today.    _________________________________________________________________________________________________________

## 2019-11-20 NOTE — Progress Notes
[facility] HISTORY [facility] PHYSICAL         HISTORY OF PRESENT ILLNESS:      Referring physician: Pablo [place]    Chief complaint: Pain of the Lower Back, Pain of the Left Shoulder, Pain of the Middle Back (Upper back pain /), Pain of the Right Hip, Pain of the Left Hip, and New Patient (Chronic mid thoracic left shoudler pain)      KATIA SHANKAR is a 54 y.o. female who  has a past medical history of Asthma, Cognitive impairment (since 08/2008), Fibromyalgia, Hypoxia (2011), Kidney stones (1984), Migraines, Motion sickness, and Sleep apnea.  Patient-entered data is noted with quotes.    [name] is referred today by neurosurgeon Dr. Earlene Plater for neck and left shoulder pain.  This pain started after a C6-7 ACDF in 2019.  She did not have any similar pain before hand.  It is primarily in the base of the left neck and in the left posterior upper trapezius area.  It is a constant sharp and burning sensation.  It is worse with shoulder movements.  Sometimes it radiates very slightly distal to the shoulder but not any further into the arm.  She denies any numbness, tingling, or weakness in the bilateral upper extremities.  Her pain is worse when wearing a bra.  She has a history of left shoulder surgery several years ago but this pain is different.  Massage and physical therapy have not provided any long-term relief.  She has tried baclofen, duloxetine, and gabapentin without any relief.  Tramadol helps mildly.  Trigger point injections did not help on 2 different occasions.  She is also being evaluated for syncope as a separate problem.  She lives approximately [place] minutes from [facility].  She recently had plain films and MRI of the cervical spine.      Pain location: Upper back  Pain radiation:   Does the pain move into your arm or leg?: Yes  Explanation of how far pain moves:: Just over my shoulder.  Pain start date: Greater than 1 year     Inciting event: No       Description of pain: Aching, Stabbing, Burning    Average pain level:   Numbness/tingling/weakness: None      Bladder or bowel retention or incontinence?: No  Exacerbating factors: Other (comment) On my shoulder and is the bra.    Alleviating factors: Rest, Other (comment) Massage chair    Imaging:   Have you had an X-ray for this condition?: Yes  Have you had an MRI for this condition?: No  Physical therapy/home exercise:   Have you had physical therapy for this condition?: Yes  What was the approximate date physical therapy was started?: 10/19/19  How long did you do physical therapy? (in weeks): 3  Anticoagulants:   Are you taking one of the following blood thinners?: No        Pain diagram:               Medical History:   Diagnosis Date   ? Asthma     allergies and exercise induced   ? Cognitive impairment since 08/2008    /memory; most likely 2/2 adjustment reaction with depressive sx's as  she had a prolonged period of illness after sx   ? Fibromyalgia    ? Hypoxia 2011    post procedural pain medicine   ? Kidney stones 1984   ? Migraines    ?  Motion sickness    ? Sleep apnea     uses CPAP machine         Surgical History:   Procedure Laterality Date   ? APPENDECTOMY  1983   ? HX HYSTERECTOMY  1984   ? CHOLECYSTECTOMY  2002   ? CARPAL TUNNEL RELEASE Bilateral 2004   ? HAND SURGERY Left 2004    thumb   ? LYSIS OF ADHESIONS  2011    abdominal adhesiolysis surgery   ? KNEE SURGERY Left 2014    meniscus repair   ? KIDNEY STONE SURGERY  05/2017    7mm stone removed   ? RIGHT ACHILLES TAKEDOWN AND REPAIR Right 05/18/2017    Performed by Tanja Port, MD at [facility]   ? REPAIR ACHILLES TENDON Left 12/22/2017    Performed by Vopat, Lowry Ram, MD at [facility]   ? PLANTAR FASCIA RELEASE Left 12/22/2017    Performed by Vopat, Lowry Ram, MD at [facility]   ? FUSION SPINE ANTERIOR WITH DISCECTOMY/ DECOMPRESSION - CERVICAL 6-7 WITH ANTERIOR SPINAL INSTRUMENTATION Right 03/06/2018    Performed by Addison Lank, MD at [facility]   ? MICROSURGICAL TECHNIQUES - REQUIRING OPERATING MICROSCOPE USE N/A 03/06/2018    Performed by Addison Lank, MD at [facility]   ? COLONOSCOPY     ? GASTRIC BYPASS      12/2017   ? HYSTEROSCOPY      endometriosis   ? SHOULDER SURGERY Bilateral 2003, 2004         Allergies   Allergen Reactions   ? Adhesive Tape (Rosins) RASH   ? Bee [Venom-Honey Bee] EDEMA     carries epipen   ? Latex RASH   ? Succinylcholine MUSCLE PAIN   ? Benadryl [Diphenhydramine Hcl] SEE COMMENTS     restless leg syndrome   ? Chromium REDNESS   ? Codeine NAUSEA AND VOMITING   ? Hydrocodone ITCHING   ? Metoclopramide STOMACH UPSET   ? Morphine NAUSEA AND VOMITING   ? Oxycodone ITCHING     tolerates with antihistamine         Current Outpatient Medications on File Prior to Visit   Medication Sig Dispense Refill   ? albuterol sulfate (PROAIR HFA) 90 mcg/actuation aerosol inhaler Inhale 1-2 puffs by mouth into the lungs every 6 hours as needed.     ? ascorbic acid (VITAMIN C) 500 mg tablet Take 1 tablet by mouth twice daily.     ? cholecalciferol (VITAMIN D-3) 1,000 units tablet Take 1,000 Units by mouth daily.     ? Cyanocobalamin-Cobamamide (B12) 5,000-100 mcg lozg Place  under tongue.     ? docusate (COLACE) 100 mg capsule Take one capsule by mouth twice daily as needed for Constipation. 60 capsule 0   ? duloxetine DR (CYMBALTA) 60 mg capsule Take 60 mg by mouth twice daily.     ? estradiol cypionate(+) (DEPO-ESTRADIOL) 5 mg/mL oil Inject 0.4 mg into the muscle. Every 19 days      ? famotidine (PEPCID) 20 mg tablet Take 20 mg by mouth twice daily.     ? fexofenadine(+) (ALLEGRA) 180 mg tablet Take 180 mg by mouth at bedtime daily.     ? fluoxetine (PROZAC) 20 mg capsule Take 20 mg by mouth daily.     ? MAGNESIUM PO Take 1 tablet by mouth daily. 100mg      ? Multivitamins with Fluoride (MULTI-VITAMIN PO) Take  by mouth.     ? MYRBETRIQ  50 mg tablet Take 50 mg by mouth daily.     ? nortriptyline (PAMELOR) 25 mg capsule Take 25 mg by mouth at bedtime daily.  1   ? omeprazole DR (PRILOSEC) 20 mg capsule Take 20 mg by mouth twice daily.     ? other medication [employer] Multivitamin 1 tablet by mouth twice daily     ? Potassium 99 mg tab Take  by mouth.     ? traMADol (ULTRAM) 50 mg tablet Take one tablet by mouth every 4 hours as needed. 50 tablet 1   ? ultrasonic cleaner,holding sol (POWER CLEANER MISC) Use 400 mg as directed.     ? zinc sulfate 220 mg (50 mg elemental zinc) capsule Take 220 mg by mouth daily.       No current facility-administered medications on file prior to visit.         family history includes Cancer in her maternal aunt and maternal uncle; Coronary Artery Disease in her father; Diabetes in her father; Heart problem in her father; Other in her maternal grandfather; Transient Ischaemic Attack in her father.      Social History     Socioeconomic History   ? Marital status: Married     Spouse name: Not on file   ? Number of children: Not on file   ? Years of education: Not on file   ? Highest education level: Not on file   Occupational History   ? Not on file   Tobacco Use   ? Smoking status: Never Smoker   ? Smokeless tobacco: Never Used   Substance and Sexual Activity   ? Alcohol use: No   ? Drug use: No   ? Sexual activity: Not on file   Other Topics Concern   ? Not on file   Social History Narrative   ? Not on file               Review of Systems   Musculoskeletal: Positive for back pain, myalgias and neck pain.   Neurological: Positive for syncope.   All other systems reviewed and are negative.        PHYSICAL EXAM:    Vitals:    11/20/19 1413   BP: 127/86   BP Source: Arm, Right Upper   Patient Position: Sitting   Pulse: 88   Resp: 20   SpO2: 95%   Weight: 73.4 kg (161 lb 12.8 oz)   Height: 157.5 cm (62)   PainSc: Eight     Oswestry Total Score:: 24  Pain Score: Eight  Body mass index is 29.59 kg/m?Marland Kitchen    General: Alert, cooperative, no acute distress.  HEENT: Normocephalic, atraumatic.  Neck: Supple.  Lungs: Unlabored respirations, bilateral and equal chest excursion.  Heart: Regular rate.  Skin: Warm and dry to touch.  Abdomen: Nondistended.    Cervical spine:  Cervical tenderness: Yes lower neck  Pain with extension: No  Pain with lateral flexion: None  Limited neck ROM: No  Sensation to light touch: Intact and equal in the bilateral upper extremities  Hoffman sign: Negative bilaterally    Upper extremity strength:   Root Right Left   Shoulder Abduction C5 5 5   Elbow Flexion C5 5 5   Elbow Extension C7 5 5   Wrist Extension C6 5 5   Finger Flexion C8 5 5   Finger Abduction T1 5 5     Upper extremity reflexes:   Right Left   Biceps  2/4 2/4   Triceps 2/4 2/4   Brachioradialis 2/4 2/4       Neurological: Alert and oriented x3.       RADIOGRAPHIC EVALUATION:    MRI C-Spine Results:    Results for orders placed during the hospital encounter of 06/02/18    MRI C-SPINE WO CONTRAST    Narrative  EXAM: MRI C-SPINE    HISTORY:    53 old female, cervical stenosis    Technique: Multiple sagittal and axial MR sequences were obtained of the cervical spine.    Comparison: C-spine radiograph May 23, 2018 and MRI C-spine December 09, 2017    FINDINGS:    Prior C6-C7 ACDF. Trace anterolisthesis of C2-C3. The vertebral body heights are maintained. There is normal marrow signal.  The cervical cord is normal in size and signal. The paraspinous soft tissues are unremarkable.    C2-C3: Trace anterolisthesis. Asymmetric right facet arthropathy. Mild right foraminal stenosis. No central stenosis or left foraminal stenosis    C3-C4: Asymmetric left uncovertebral joint enlargement and left facet arthropathy. Moderate left foraminal narrowing.    C4-C5: Uncovertebral and facet joint hypertrophy resulting in mild bilateral foraminal stenosis. No significant central spinal stenosis.    C5-C6: Mild disc osteophyte complex and uncovertebral joint enlargement greater on the left. Marked asymmetric left facet arthropathy. Mild bilateral foraminal stenosis, greater on the left.    C6-C7: Fusion across the disc space. There is asymmetric left uncovertebral joint enlargement and osteophyte formation resulting in marked left foraminal stenosis.    C7-T1: No significant spinal or neuroforaminal stenosis.    Impression  1.  Prior ACDF of C6-C7. Residual left uncovertebral joint enlargement and asymmetric osteophyte formation results in marked left foraminal stenosis at this level.  2.  Additional multiple levels with mild foraminal stenosis as discussed above. Moderate narrowing on the left at C3-4.  3.  No abnormal cord signal    By my electronic signature, I attest that I have personally reviewed the images for this examination and formulated the interpretations and opinions expressed in this report      Finalized by Marily Memos, M.D. on 06/02/2018 2:25 PM. Dictated by Delfin Gant, M.D. on 06/02/2018 2:04 PM.           IMPRESSION:    1. Cervical radiculopathy    2. Cervical spondylosis without myelopathy    3. Myofascial pain          PLAN:   JEANINE CAVEN has left-sided neck and shoulder pain that started after her ACDF at C6-C7 in 2019.  Pain is likely multifactorial.  She does have foraminal stenosis on the left side at C3-4, and I do suspect there is at least a moderate neuropathic component to her pain from this.  She likely has some pain from facet arthropathy, but I suspect this is less of a contributor.  I will schedule a C7-T1 epidural steroid injection for both diagnostic and therapeutic purposes.  If she does not improve with this, it would suggest a greater portion of her pain is coming from facet arthropathy, and we could consider interventions targeting that.

## 2019-11-20 NOTE — Progress Notes
Date of Service: 11/20/2019     Subjective:               Ashley Gaines is a 54 y.o. female.        History of Present Illness    54 year old female referred by Dr. Maryellen Pile for syncope.  She saw him for midthoracic pain and left shoulder pain for over 18 months.  She failed nonoperative management by PCP, massage and pain meds.  She has a history of C6-7 ACDF in December 2019.    She has followed with a cardiologist due to episodes of syncope.    Syncope first began 1 year ago.  She has had 3 events.  Last episode was in May 2021.  She thinks dizizness is worse when she turns head to the right.      Dizzy spells happen daily.  Events are more likely to happen when seated and then stands.  She describes it as though she is moving or as though I am drunk.  She has had no vision change.  No nausea reported.      She has no aura that she will lose consciousness.  She does nothing when unconsciousness.  Husband has witnessed.  No convulsion or incontinence.  No aura.  She returns to baseline in a few minutes.  She feels fatigued for the rest of the day thereafter.      She goes to he chiropractor and has had neck manuipulation. She has had no known neck pain with the spells.  She has no headache with the spells.  She denies chest palpitations.    Cardiologist did a carotid ultrasound, stress test, echo and event monitor and discharged her.  She has not had any CTA neck, EEG or tilt test.      She also says she has episodes of hypoxia.  In 2011 she states she was given too much narcotic in hospital and had prominent O2 desaturation.         Review of Systems   Constitutional: Positive for fatigue.   HENT: Negative.    Eyes: Positive for redness and itching.   Respiratory: Positive for apnea, cough and shortness of breath.    Cardiovascular: Negative.    Gastrointestinal: Negative.    Endocrine: Negative.    Genitourinary: Negative.    Musculoskeletal: Positive for arthralgias, back pain, myalgias and neck pain.   Skin: Negative.    Allergic/Immunologic: Positive for environmental allergies and food allergies.   Neurological: Positive for dizziness, syncope, light-headedness and headaches.   Hematological: Negative.    Psychiatric/Behavioral: Negative.        Chief Complaint:  Chief Complaint   Patient presents with   ? Left Hip - Pain   ? Right Hip - Pain   ? New Patient   ? Fibromyalgia       Past Medical History:  Medical History:   Diagnosis Date   ? Asthma     allergies and exercise induced   ? Cognitive impairment since 08/2008    /memory; most likely 2/2 adjustment reaction with depressive sx's as  she had a prolonged period of illness after sx   ? Fibromyalgia    ? Hypoxia 2011    post procedural pain medicine   ? Kidney stones 1984   ? Migraines    ? Motion sickness    ? Sleep apnea     uses CPAP machine       Surgical History:  Surgical History:  Procedure Laterality Date   ? APPENDECTOMY  1983   ? HX HYSTERECTOMY  1984   ? CHOLECYSTECTOMY  2002   ? CARPAL TUNNEL RELEASE Bilateral 2004   ? HAND SURGERY Left 2004    thumb   ? LYSIS OF ADHESIONS  2011    abdominal adhesiolysis surgery   ? KNEE SURGERY Left 2014    meniscus repair   ? KIDNEY STONE SURGERY  05/2017    7mm stone removed   ? RIGHT ACHILLES TAKEDOWN AND REPAIR Right 05/18/2017    [facility] by [facility]   ? REPAIR ACHILLES TENDON Left 12/22/2017    Performed by Vopat, Lowry Ram, [facility] at [facility]   ? PLANTAR FASCIA RELEASE Left 12/22/2017    Performed by Vopat, Lowry Ram, [facility]   ? FUSION SPINE ANTERIOR WITH DISCECTOMY/ DECOMPRESSION - CERVICAL 6-7 WITH ANTERIOR SPINAL INSTRUMENTATION Right 03/06/2018    Performed by Addison Lank, MD at [facility]   ? MICROSURGICAL TECHNIQUES - REQUIRING OPERATING MICROSCOPE USE N/A 03/06/2018    Performed by Add[facility] Lank, [facility] at [facility]   ? COLONOSCOPY     ? GASTRIC BYPASS      12/2017   ? HYSTEROSCOPY      endometriosis   ? SHOULDER SURGERY Bilateral 2003, 2004       Social History:   Social History Tobacco Use   ? Smoking status: Never Smoker   ? Smokeless tobacco: Never Used   Substance Use Topics   ? Alcohol use: No   ? Drug use: No             Family History:  Family History   Problem Relation Age of Onset   ? Coronary Artery Disease Father    ? Diabetes Father    ? Transient Ischaemic Attack Father    ? Heart problem Father    ? Other Maternal Grandfather         memory decline at an old age   ? Cancer Maternal Aunt    ? Cancer Maternal Uncle        Allergies:  Allergies   Allergen Reactions   ? Adhesive Tape (Rosins) RASH   ? Bee [Venom-Honey Bee] EDEMA     carries epipen   ? Latex RASH   ? Succinylcholine MUSCLE PAIN   ? Benadryl [Diphenhydramine Hcl] SEE COMMENTS     restless leg syndrome   ? Chromium REDNESS   ? Codeine NAUSEA AND VOMITING   ? Hydrocodone ITCHING   ? Metoclopramide STOMACH UPSET   ? Morphine NAUSEA AND VOMITING   ? Oxycodone ITCHING     tolerates with antihistamine       Objective:         ? albuterol sulfate (PROAIR HFA) 90 mcg/actuation aerosol inhaler Inhale 1-2 puffs by mouth into the lungs every 6 hours as needed.   ? ascorbic acid (VITAMIN C) 500 mg tablet Take 1 tablet by mouth twice daily.   ? baclofen (LIORESAL) 10 mg tablet 5mg  three times daily and 10 mg at bedtime   ? cholecalciferol (VITAMIN D-3) 1,000 units tablet Take 1,000 Units by mouth daily.   ? docusate (COLACE) 100 mg capsule Take one capsule by mouth twice daily as needed for Constipation.   ? estradiol cypionate(+) (DEPO-ESTRADIOL) 5 mg/mL oil Inject 0.4 mg into the muscle. Every 19 days    ? fexofenadine(+) (ALLEGRA) 180 mg tablet Take 180 mg by mouth  at bedtime daily.   ? fluoxetine (PROZAC) 20 mg capsule Take 20 mg by mouth daily.   ? gabapentin (NEURONTIN) 300 mg capsule TAKE 1 CAPSULE BY MOUTH TWICE A DAY   ? Magnesium 200 mg tab Take 1 tablet by mouth daily.   ? MYRBETRIQ 50 mg tablet Take 50 mg by mouth daily.   ? nortriptyline (PAMELOR) 25 mg capsule Take 25 mg by mouth at bedtime daily.   ? omeprazole DR (PRILOSEC) 20 mg capsule Take 20 mg by mouth twice daily.   ? other medication Journey Bariastic Multivitamin 1 tablet by mouth twice daily   ? traMADol (ULTRAM) 50 mg tablet Take one tablet by mouth every 4 hours as needed.   ? zinc sulfate 220 mg (50 mg elemental zinc) capsule Take 220 mg by mouth daily.     Vitals:    11/20/19 0858   BP: 126/86   Pulse: 94   Temp: 36.7 ?C (98.1 ?F)   SpO2: 97%   Weight: 73.4 kg (161 lb 12.8 oz)   Height: 157.5 cm (62)   PainSc: Eight     Body mass index is 29.59 kg/m?Marland Kitchen     Female Opioid Risk Score: 5 (10/24/2019  1:14 PM)  Opioid Risk Category: Moderate Risk (10/24/2019  1:14 PM)    Is a controlled substance agreement on file? Not Applicable      Physical Exam    General: WG/WN/WD, ASA, demeanor, attention and memory appropriate, calm, cooperative, follows commands  HEENT: NC/AT  Cardiac: RRR without murmur, no carotid bruit  Neck: supple, full ROM    Fundoscopy: clear, sharp disc margins, no papilledema OU  Speech: fluent, no dysarthria or aphasia  Mental status: Alert, oriented x 4, NAD    CN: VFF without cut/hemianopsia, PERRL, EOMI without restriction or nystagmus, facial sensation symmetric (V1-V3) to LT bilaterally, No facial droop or ptosis, hearing equal to FR, palatal rise symmetric, cough response strong and intact, shoulder shrug symmetric, tongue midline    Motor: appendicular tone normal, no rigidity or spasticity, no pronator drift of upper extremities  Strength:   UEs: 5/5 SA/EF/EE/WE/WF  LEs: 5/5 HF/KE/DF/PF  No involuntary movements, no tremor    Sensation: LT symmetric in all limbs, no dermatomal deficit    DTRs: 2/2 in BR/B/P 1/1A, downgoing plantar reflexes bilaterally    Coordination: normal bilateral FTN without dysmetria  Gait: normal primary base and station, no ataxia         Assessment and Plan:    1. Syncope with collapse    Plan:  1. Suspect neurocardiogenic/vasovagal or hemodynamic - doubt primary neurogenic syncope  2. Order EEG (awake/sleep) - events unlikely epileptic  3. Order Tilt table test - ?orthostasis  4. Order CTA neck - positional dizziness and has had chiropractic manipulation - r/o vertebral stenosis/dissection/VBI  5. Gradual position change, hydration and increased salt intake advised  6. Get cardiac workup - echo, cardiac monitoring, stress test etc.  7. No indication MRI head  8. RTC 4 months.  Caution advised with activity should events recur.      Patient advised to receive COVID-19 vaccine today

## 2019-11-22 ENCOUNTER — Ambulatory Visit: Admit: 2019-11-22 | Discharge: 2019-11-22 | Payer: BC Managed Care – HMO

## 2019-11-22 ENCOUNTER — Encounter: Admit: 2019-11-22 | Discharge: 2019-11-22 | Payer: BC Managed Care – HMO

## 2019-11-22 DIAGNOSIS — R55 Syncope and collapse: Principal | ICD-10-CM

## 2019-11-22 NOTE — Progress Notes
Routine 25 minute outpatient EEG completed without complications. Recording began at 9:27:00.    Paperwork including the patient history and EEG information sheet was provided to the patient.     The test was explained to the patient in detail and all questions were answered regarding the performing of the EEG.     All electrodes were below 10,000kOhm and recording properly. Photic Stimulation was performed. Hyperventilation was not performed due to current ACNS guidelines.    After the test the scalp was inspected for any skin breakdown and was cleaned with warm water and  a washcloth. Education was given to the patient about proper hair care after the EEG.    The patient was educated to check in MyChart or contact the physician's office in approximately 2 weeks regarding the results of their EEG.

## 2019-11-23 ENCOUNTER — Encounter: Admit: 2019-11-23 | Discharge: 2019-11-23 | Payer: BC Managed Care – HMO

## 2019-11-23 NOTE — Telephone Encounter
Patient left message asking how to schedule her Tilt Table Test in EP lab cardiology. Spoke to [place] in cardiology [phone] who will have EP tech contact patient to schedule. Called patient and encouraged her to call back if she had not been contacted with in a few days.

## 2019-11-30 ENCOUNTER — Ambulatory Visit: Admit: 2019-11-30 | Discharge: 2019-11-30 | Payer: BC Managed Care – HMO

## 2019-11-30 ENCOUNTER — Encounter: Admit: 2019-11-30 | Discharge: 2019-11-30 | Payer: BC Managed Care – HMO

## 2019-11-30 DIAGNOSIS — R55 Syncope and collapse: Secondary | ICD-10-CM

## 2019-11-30 MED ORDER — IOHEXOL 350 MG IODINE/ML IV SOLN
80 mL | Freq: Once | INTRAVENOUS | 0 refills | Status: CP
Start: 2019-11-30 — End: ?
  Administered 2019-11-30: 16:00:00 80 mL via INTRAVENOUS

## 2019-11-30 MED ORDER — SODIUM CHLORIDE 0.9 % IJ SOLN
50 mL | Freq: Once | INTRAVENOUS | 0 refills | Status: CP
Start: 2019-11-30 — End: ?
  Administered 2019-11-30: 16:00:00 50 mL via INTRAVENOUS

## 2019-12-10 ENCOUNTER — Encounter: Admit: 2019-12-10 | Discharge: 2019-12-10 | Payer: BC Managed Care – HMO

## 2019-12-10 DIAGNOSIS — M5412 Radiculopathy, cervical region: Secondary | ICD-10-CM

## 2019-12-11 ENCOUNTER — Ambulatory Visit: Admit: 2019-12-11 | Discharge: 2019-12-11 | Payer: BC Managed Care – HMO

## 2019-12-11 ENCOUNTER — Encounter: Admit: 2019-12-11 | Discharge: 2019-12-11 | Payer: BC Managed Care – HMO

## 2019-12-11 DIAGNOSIS — M5412 Radiculopathy, cervical region: Secondary | ICD-10-CM

## 2019-12-11 MED ORDER — IOHEXOL 240 MG IODINE/ML IV SOLN
2.5 mL | Freq: Once | EPIDURAL | 0 refills | Status: CP
Start: 2019-12-11 — End: ?
  Administered 2019-12-11: 21:00:00 2.5 mL via EPIDURAL

## 2019-12-11 MED ORDER — TRIAMCINOLONE ACETONIDE 40 MG/ML IJ SUSP
80 mg | Freq: Once | EPIDURAL | 0 refills | Status: CP
Start: 2019-12-11 — End: ?
  Administered 2019-12-11: 21:00:00 80 mg via EPIDURAL

## 2019-12-11 NOTE — Progress Notes

## 2019-12-11 NOTE — Patient Instructions
Procedure Completed Today: Cervical Epidural Steroid Injection    Important information following your procedure today: You may drive today    1. Pain relief may not be immediate. It is possible you may even experience an increase in pain during the first 24-48 hours followed by a gradual decrease of your pain.  2. Though the procedure is generally safe and complications are rare, we do ask that you be aware of any of the following:   ? Any swelling, persistent redness, new bleeding, or drainage from the site of the injection.  ? You should not experience a severe headache.  ? You should not run a fever over 101? F.  ? New onset of sharp, severe back & or neck pain.  ? New onset of upper or lower extremity numbness or weakness.  ? New difficulty controlling bowel or bladder function after the injection.  ? New shortness of breath.    If any of these occur, please call to report this occurrence to a nurse at [phone]. If you are calling after 4:00 p.m., on weekends or holidays please call [phone] and ask to have the resident physician on call for the physician paged or go to your local emergency room.  3. You may experience soreness at the injection site. Ice can be applied at 20 minute intervals. Avoid application of direct heat, hot showers or hot tubs today.  4. Avoid strenuous activity today. You may resume your regular activities and exercise tomorrow.  5. Patients with diabetes may see an elevation in blood sugars for 7-10 days after the injection. It is important to pay close attention to your diet, check your blood sugars daily and report extreme elevations to the physician that treats your diabetes.  6. Patients taking a daily blood thinner can resume their regular dose this evening.  7. It is important that you take all medications ordered by your pain physician. Taking medication as ordered is an important part of your pain care plan. If you cannot continue the medication plan, please notify the physician.     Possible side effects to steroids that may occur:  ? Flushing or redness of the face  ? Irritability  ? Fluid retention  ? Change in women?s menses    The following medications were used: Triamcinolone   and Contrast Dye

## 2019-12-11 NOTE — Progress Notes
Patient seen and examined. No changes to below H&P from 11/20/19. Plan C7-T1 ESI.    SPINE CENTER HISTORY AND PHYSICAL         HISTORY OF PRESENT ILLNESS:      Referring physician: Philomena Course    Chief complaint: Procedure      Ashley Gaines is a 54 y.o. female who  has a past medical history of Asthma, Cognitive impairment (since 08/2008), Fibromyalgia, Hypoxia (2011), Kidney stones (1984), Migraines, Motion sickness, and Sleep apnea.  Patient-entered data is noted with quotes.    Ashley Gaines is referred today by neurosurgeon Dr. Earlene Plater for neck and left shoulder pain.  This pain started after a C6-7 ACDF in 2019.  She did not have any similar pain before hand.  It is primarily in the base of the left neck and in the left posterior upper trapezius area.  It is a constant sharp and burning sensation.  It is worse with shoulder movements.  Sometimes it radiates very slightly distal to the shoulder but not any further into the arm.  She denies any numbness, tingling, or weakness in the bilateral upper extremities.  Her pain is worse when wearing a bra.  She has a history of left shoulder surgery several years ago but this pain is different.  Massage and physical therapy have not provided any long-term relief.  She has tried baclofen, duloxetine, and gabapentin without any relief.  Tramadol helps mildly.  Trigger point injections did not help on 2 different occasions.  She is also being evaluated for syncope as a separate problem.  She [place] approximately [place] minutes from [facility].  She recently had plain films and MRI of the cervical spine.      Pain location:    Pain radiation:      Pain start date:       Inciting event:         Description of pain:      Average pain level:   Numbness/tingling/weakness:        Bladder or bowel retention or incontinence?: No  Exacerbating factors:        Alleviating factors:        Imaging:         Physical therapy/home exercise:      Anticoagulants: Pain diagram:               Medical History:   Diagnosis Date   ? Asthma     allergies and exercise induced   ? Cognitive impairment since 08/2008    /memory; most likely 2/2 adjustment reaction with depressive sx's as  she had a prolonged period of illness after sx   ? Fibromyalgia    ? Hypoxia 2011    post procedural pain medicine   ? Kidney stones 1984   ? Migraines    ? Motion sickness    ? Sleep apnea     uses CPAP machine         Surgical History:   Procedure Laterality Date   ? APPENDECTOMY  1983   ? HX HYSTERECTOMY  1984   ? CHOLECYSTECTOMY  2002   ? CARPAL TUNNEL RELEASE Bilateral 2004   ? HAND SURGERY Left 2004    thumb   ? LYSIS OF ADHESIONS  2011    abdominal adhesiolysis surgery   ? KNEE SURGERY Left 2014    meniscus repair   ? KIDNEY STONE SURGERY  05/2017    7mm stone removed   ?  RIGHT ACHILLES TAKEDOWN AND REPAIR Right 05/18/2017    [facility]formed by [facility] Port, [facility]   ? REPAIR ACHILLES TENDON Left 12/22/2017    Performed by Vopat, Lowry Ram, [facility]   ? PLANTAR FASCIA RELEASE Left 12/22/2017    Performed by Vopat, Lowry Ram, [facility]   ? FUSION SPINE ANTERIOR WITH DISCECTOMY/ DECOMPRESSION - CERVICAL 6-7 WITH ANTERIOR SPINAL INSTRUMENTATION Right 03/06/2018    [facility]formed by Add[facility] Lank, [facility] at [facility]   ? MICROSURGICAL TECHNIQUES - REQUIRING OPERATING MICROSCOPE USE N/A 03/06/2018    Performed by [facility] Lank, [facility] at [facility]   ? COLONOSCOPY     ? GASTRIC BYPASS      12/2017   ? HYSTEROSCOPY      endometriosis   ? SHOULDER SURGERY Bilateral 2003, 2004         Allergies   Allergen Reactions   ? Adhesive Tape (Rosins) RASH   ? Bee [Venom-Honey Bee] EDEMA     carries epipen   ? Latex RASH   ? Succinylcholine MUSCLE PAIN   ? Benadryl [Diphenhydramine Hcl] SEE COMMENTS     restless leg syndrome   ? Chromium REDNESS   ? Codeine NAUSEA AND VOMITING   ? Hydrocodone ITCHING   ? Metoclopramide STOMACH UPSET   ? Morphine NAUSEA AND VOMITING   ? Oxycodone ITCHING     tolerates with antihistamine         Current Outpatient Medications on File Prior to Visit   Medication Sig Dispense Refill   ? albuterol sulfate (PROAIR HFA) 90 mcg/actuation aerosol inhaler Inhale 1-2 puffs by mouth into the lungs every 6 hours as needed.     ? ascorbic acid (VITAMIN C) 500 mg tablet Take 1 tablet by mouth twice daily.     ? cholecalciferol (VITAMIN D-3) 1,000 units tablet Take 1,000 Units by mouth daily.     ? Cyanocobalamin-Cobamamide (B12) 5,000-100 mcg lozg Place  under tongue.     ? docusate (COLACE) 100 mg capsule Take one capsule by mouth twice daily as needed for Constipation. 60 capsule 0   ? duloxetine DR (CYMBALTA) 60 mg capsule Take 60 mg by mouth twice daily.     ? estradiol cypionate(+) (DEPO-ESTRADIOL) 5 mg/mL oil Inject 0.4 mg into the muscle. Every 19 days      ? famotidine (PEPCID) 20 mg tablet Take 20 mg by mouth twice daily.     ? fexofenadine(+) (ALLEGRA) 180 mg tablet Take 180 mg by mouth at bedtime daily.     ? fluoxetine (PROZAC) 20 mg capsule Take 20 mg by mouth daily.     ? MAGNESIUM PO Take 1 tablet by mouth daily. 100mg      ? methocarbamoL (ROBAXIN) 750 mg tablet Take one tablet by mouth three times daily as needed for Spasms. 90 tablet 2   ? Multivitamins with Fluoride (MULTI-VITAMIN PO) Take  by mouth.     ? MYRBETRIQ 50 mg tablet Take 50 mg by mouth daily.     ? nortriptyline (PAMELOR) 25 mg capsule Take 25 mg by mouth at bedtime daily.  1   ? omeprazole DR (PRILOSEC) 20 mg capsule Take 20 mg by mouth twice daily.     ? other medication [employer] Bariastic Multivitamin 1 tablet by mouth twice daily     ? Potassium 99 mg tab Take  by mouth.     ? traMADol (ULTRAM) 50 mg tablet Take one tablet by  mouth every 4 hours as needed. 50 tablet 1   ? ultrasonic cleaner,holding sol (POWER CLEANER MISC) Use 400 mg as directed.     ? zinc sulfate 220 mg (50 mg elemental zinc) capsule Take 220 mg by mouth daily.       No current facility-administered medications on file prior to visit. family history includes Cancer in her maternal aunt and maternal uncle; Coronary Artery Disease in her father; Diabetes in her father; Heart problem in her father; Other in her maternal grandfather; Transient Ischaemic Attack in her father.      Social History     Socioeconomic History   ? Marital status: Married     Spouse name: Not on file   ? Number of children: Not on file   ? Years of education: Not on file   ? Highest education level: Not on file   Occupational History   ? Not on file   Tobacco Use   ? Smoking status: Never Smoker   ? Smokeless tobacco: Never Used   Substance and Sexual Activity   ? Alcohol use: No   ? Drug use: No   ? Sexual activity: Not on file   Other Topics Concern   ? Not on file   Social History Narrative   ? Not on file               Review of Systems   Musculoskeletal: Positive for back pain, myalgias and neck pain.   Neurological: Positive for syncope.   All other systems reviewed and are negative.        PHYSICAL EXAM:    Vitals:    12/11/19 1526   BP: (!) 152/75   Pulse: 58   Resp: 17   Temp: 36.7 ?C (98 ?F)   SpO2: 98%   Weight: 72.6 kg (160 lb)   Height: 157.5 cm (62)   PainSc: Seven        Pain Score: Seven  Body mass index is 29.26 kg/m?Marland Kitchen    General: Alert, cooperative, no acute distress.  HEENT: Normocephalic, atraumatic.  Neck: Supple.  Lungs: Unlabored respirations, bilateral and equal chest excursion.  Heart: Regular rate.  Skin: Warm and dry to touch.  Abdomen: Nondistended.    Cervical spine:  Cervical tenderness: Yes lower neck  Pain with extension: No  Pain with lateral flexion: None  Limited neck ROM: No  Sensation to light touch: Intact and equal in the bilateral upper extremities  Hoffman sign: Negative bilaterally    Upper extremity strength:   Root Right Left   Shoulder Abduction C5 5 5   Elbow Flexion C5 5 5   Elbow Extension C7 5 5   Wrist Extension C6 5 5   Finger Flexion C8 5 5   Finger Abduction T1 5 5     Upper extremity reflexes:   Right Left Biceps 2/4 2/4   Triceps 2/4 2/4   Brachioradialis 2/4 2/4       Neurological: Alert and oriented x3.       RADIOGRAPHIC EVALUATION:    MRI C-Spine Results:    Results for orders placed during the hospital encounter of 06/02/18    MRI C-SPINE WO CONTRAST    Narrative  EXAM: MRI C-SPINE    HISTORY:    54 old female, cervical stenosis    Technique: Multiple sagittal and axial MR sequences were obtained of the cervical spine.    Comparison: C-spine radiograph May 23, 2018 and MRI C-spine December 09, 2017    FINDINGS:    Prior C6-C7 ACDF. Trace anterolisthesis of C2-C3. The vertebral body heights are maintained. There is normal marrow signal.  The cervical cord is normal in size and signal. The paraspinous soft tissues are unremarkable.    C2-C3: Trace anterolisthesis. Asymmetric right facet arthropathy. Mild right foraminal stenosis. No central stenosis or left foraminal stenosis    C3-C4: Asymmetric left uncovertebral joint enlargement and left facet arthropathy. Moderate left foraminal narrowing.    C4-C5: Uncovertebral and facet joint hypertrophy resulting in mild bilateral foraminal stenosis. No significant central spinal stenosis.    C5-C6: Mild disc osteophyte complex and uncovertebral joint enlargement greater on the left. Marked asymmetric left facet arthropathy. Mild bilateral foraminal stenosis, greater on the left.    C6-C7: Fusion across the disc space. There is asymmetric left uncovertebral joint enlargement and osteophyte formation resulting in marked left foraminal stenosis.    C7-T1: No significant spinal or neuroforaminal stenosis.    Impression  1.  Prior ACDF of C6-C7. Residual left uncovertebral joint enlargement and asymmetric osteophyte formation results in marked left foraminal stenosis at this level.  2.  Additional multiple levels with mild foraminal stenosis as discussed above. Moderate narrowing on the left at C3-4.  3.  No abnormal cord signal    By my electronic signature, I attest that I have personally reviewed the images for this examination and formulated the interpretations and opinions expressed in this report      Finalized by Marily Memos, M.D. on 06/02/2018 2:25 PM. Dictated by Delfin Gant, M.D. on 06/02/2018 2:04 PM.           IMPRESSION:    1. Cervical radiculopathy          PLAN:   LOCHLAN TORNETTA has left-sided neck and shoulder pain that started after her ACDF at C6-C7 in 2019.  Pain is likely multifactorial.  She does have foraminal stenosis on the left side at C3-4, and I do suspect there is at least a moderate neuropathic component to her pain from this.  She likely has some pain from facet arthropathy, but I suspect this is less of a contributor.  I will schedule a C7-T1 epidural steroid injection for both diagnostic and therapeutic purposes.  If she does not improve with this, it would suggest a greater portion of her pain is coming from facet arthropathy, and we could consider interventions targeting that.

## 2019-12-12 NOTE — Procedures
Attending Surgeon: Philomena Course, MD    Anesthesia: Local    Pre-Procedure Diagnosis:   1. Cervical radiculopathy        Post-Procedure Diagnosis:   1. Cervical radiculopathy        [facility] INJECT INTERLAM CRV/THRC  Procedure: epidural - interlaminar    Laterality: n/a   on 12/11/2019 3:30 PM  Location: cervical -  C7-T1      Consent:   Consent obtained: written  Consent given by: patient  Risks discussed: allergic reaction, bleeding, infection, nerve damage, no change or worsening in pain and reaction to medication    Discussed with patient the purpose of the treatment/procedure, other ways of treating my condition, including no treatment/ procedure and the risks and benefits of the alternatives. Patient has decided to proceed with treatment/procedure.        Universal Protocol:  Relevant documents: relevant documents present and verified  Test results: test results available and properly labeled  Imaging studies: imaging studies available  Required items: required blood products, implants, devices, and special equipment available  Site marked: the operative site was marked  Patient identity confirmed: Patient identify confirmed verbally with patient.        Time out: Immediately prior to procedure a time out was called to verify the correct patient, procedure, equipment, support staff and site/side marked as required      Procedures Details:   Indications: pain   Prep: chlorhexidine  Patient position: prone  Estimated Blood Loss: minimal  Specimens: none  Number of Joints: 1  Approach: midline  Guidance: fluoroscopy  Contrast: Procedure confirmed with contrast under live fluoroscopy.  Needle and Epidural Catheter: tuohy  Needle size: 22 G  Injection procedure: Negative aspiration for blood  Amount Injected:   C7-T1: 2mL  Patient tolerance: Patient tolerated the procedure well with no immediate complications. Pressure was applied, and hemostasis was accomplished.  Comments: 80mg  triamcinolone injected after contrast epidurogram        Estimated blood loss: none or minimal  Specimens: none  Patient tolerated the procedure well with no immediate complications. Pressure was applied, and hemostasis was accomplished.

## 2019-12-14 ENCOUNTER — Encounter: Admit: 2019-12-14 | Discharge: 2019-12-14 | Payer: BC Managed Care – HMO

## 2019-12-14 NOTE — Telephone Encounter
Spoke with pt directly, instructed:  Where to go and time to arrive, to be NPO four hours prior to TILT, to not wear lotions/oils to chest area and why, to wear comfortable clothing, to stay well hydrated as will get an IV, to bring a driver.  Pt plans to get a Covid swab in [place] today and to sequester over weekend and will have husband accompany to [facility] this next Tuesday by 1015.

## 2019-12-18 ENCOUNTER — Encounter: Admit: 2019-12-18 | Discharge: 2019-12-18 | Payer: BC Managed Care – HMO

## 2019-12-18 ENCOUNTER — Ambulatory Visit: Admit: 2019-12-18 | Discharge: 2019-12-18 | Payer: BC Managed Care – HMO

## 2019-12-18 DIAGNOSIS — R55 Syncope and collapse: Secondary | ICD-10-CM

## 2019-12-18 MED ORDER — SODIUM CHLORIDE 0.9 % IV SOLP
1000 mL | INTRAVENOUS | 0 refills | Status: AC
Start: 2019-12-18 — End: ?

## 2019-12-18 MED ORDER — AMMONIA AROMATIC 15 % (W/V) IN SOLN
1 | Freq: Once | RESPIRATORY_TRACT | 0 refills | Status: AC
Start: 2019-12-18 — End: ?

## 2019-12-18 MED ORDER — NITROGLYCERIN 0.4 MG SL SUBL
.4 mg | SUBLINGUAL | 0 refills | Status: AC | PRN
Start: 2019-12-18 — End: ?

## 2020-01-08 ENCOUNTER — Encounter: Admit: 2020-01-08 | Discharge: 2020-01-08 | Payer: BC Managed Care – HMO

## 2020-01-08 ENCOUNTER — Ambulatory Visit: Admit: 2020-01-08 | Discharge: 2020-01-08 | Payer: BC Managed Care – HMO

## 2020-01-08 DIAGNOSIS — G4730 Sleep apnea, unspecified: Secondary | ICD-10-CM

## 2020-01-08 DIAGNOSIS — M7918 Myalgia, other site: Secondary | ICD-10-CM

## 2020-01-08 DIAGNOSIS — G43909 Migraine, unspecified, not intractable, without status migrainosus: Secondary | ICD-10-CM

## 2020-01-08 DIAGNOSIS — M797 Fibromyalgia: Secondary | ICD-10-CM

## 2020-01-08 DIAGNOSIS — R4189 Other symptoms and signs involving cognitive functions and awareness: Secondary | ICD-10-CM

## 2020-01-08 DIAGNOSIS — M47812 Spondylosis without myelopathy or radiculopathy, cervical region: Secondary | ICD-10-CM

## 2020-01-08 DIAGNOSIS — N200 Calculus of kidney: Secondary | ICD-10-CM

## 2020-01-08 DIAGNOSIS — M546 Pain in thoracic spine: Secondary | ICD-10-CM

## 2020-01-08 DIAGNOSIS — J45909 Unspecified asthma, uncomplicated: Secondary | ICD-10-CM

## 2020-01-08 DIAGNOSIS — T753XXA Motion sickness, initial encounter: Secondary | ICD-10-CM

## 2020-01-08 DIAGNOSIS — R0902 Hypoxemia: Secondary | ICD-10-CM

## 2020-01-08 NOTE — Progress Notes
SPINE CENTER HISTORY AND PHYSICAL         HISTORY OF PRESENT ILLNESS:      01/08/20:  [name] returns today for pain in the left side of the neck radiating to the left shoulder.  She recently had a cervical epidural steroid injection.  Unfortunately, she has not noticed any relief with this.  Her pain has not changed compared to her description in the previous HPI detailed below.    She also mentions another area of pain she would like to have evaluated.  This is in the mid back.  It is just above and below her bra strap, immediately below the shoulder blades.  This pain has been present for several years and gradually worsening.  There is no inciting event.  The pain is mostly in the middle of the back but occasionally radiates to the bilateral posterior chest.  It does not radiate to the anterior chest.  She denies any numbness or tingling in the region.  It is worse when she wears a bra and better when she removes her bra.  She had MRI of the thoracic spine in 2019 to evaluate this.  This was normal.  She has tried the medications listed below which have not helped with this type of pain.  She has never had any interventions to the thoracic spine.  She wonders if some of her thoracic pain is related to strain from wearing a bra with large breasts.        11/20/19:  [name] is referred today by neurosurgeon Dr. Earlene Plater for neck and left shoulder pain.  This pain started after a C6-7 ACDF in 2019.  She did not have any similar pain before hand.  It is primarily in the base of the left neck and in the left posterior upper trapezius area.  It is a constant sharp and burning sensation.  It is worse with shoulder movements.  Sometimes it radiates very slightly distal to the shoulder but not any further into the arm.  She denies any numbness, tingling, or weakness in the bilateral upper extremities.  Her pain is worse when wearing a bra.  She has a history of left shoulder surgery several years ago but this pain is different.  Massage and physical therapy have not provided any long-term relief.  She has tried baclofen, duloxetine, and gabapentin without any relief.  Tramadol helps mildly.  Trigger point injections did not help on 2 different occasions.  She is also being evaluated for syncope as a separate problem.  She lives approximately 90 minutes from [facility].  She recently had plain films and MRI of the cervical spine.        Pain diagram:               Medical History:   Diagnosis Date   ? Asthma     allergies and exercise induced   ? Cognitive impairment since 08/2008    /memory; most likely 2/2 adjustment reaction with depressive sx's as  she had a prolonged period of illness after sx   ? Fibromyalgia    ? Hypoxia 2011    post procedural pain medicine   ? Kidney stones 1984   ? Migraines    ? Motion sickness    ? Sleep apnea     uses CPAP machine         Surgical History:   Procedure Laterality Date   ? APPENDECTOMY  1983   ?  HX HYSTERECTOMY  1984   ? CHOLECYSTECTOMY  2002   ? CARPAL TUNNEL RELEASE Bilateral 2004   ? HAND SURGERY Left 2004    thumb   ? LYSIS OF ADHESIONS  2011    abdominal adhesiolysis surgery   ? KNEE SURGERY Left 2014    meniscus repair   ? KIDNEY STONE SURGERY  05/2017    7mm stone removed   ? RIGHT ACHILLES TAKEDOWN AND REPAIR Right 05/18/2017    Performed by Tanja Port, MD at [facility]   ? REPAIR ACHILLES TENDON Left 12/22/2017    Performed by Vopat, Lowry Ram, MD at [facility]   ? PLANTAR FASCIA RELEASE Left 12/22/2017    Performed by Vopat, Lowry Ram, MD at [facility]   ? FUSION SPINE ANTERIOR WITH DISCECTOMY/ DECOMPRESSION - CERVICAL 6-7 WITH ANTERIOR SPINAL INSTRUMENTATION Right 03/06/2018    Performed by Addison Lank, MD at [facility]   ? MICROSURGICAL TECHNIQUES - REQUIRING OPERATING MICROSCOPE USE N/A 03/06/2018    Performed by Addison Lank, MD at [facility]   ? COLONOSCOPY     ? GASTRIC BYPASS      12/2017   ? HYSTEROSCOPY      endometriosis   ? SHOULDER SURGERY Bilateral 2003, 2004 Allergies   Allergen Reactions   ? Adhesive Tape (Rosins) RASH   ? Bee [Venom-Honey Bee] EDEMA     carries epipen   ? Latex RASH   ? Succinylcholine MUSCLE PAIN   ? Benadryl [Diphenhydramine Hcl] SEE COMMENTS     restless leg syndrome   ? Chromium REDNESS   ? Codeine NAUSEA AND VOMITING   ? Hydrocodone ITCHING   ? Metoclopramide STOMACH UPSET   ? Morphine NAUSEA AND VOMITING   ? Oxycodone ITCHING     tolerates with antihistamine         Current Outpatient Medications on File Prior to Visit   Medication Sig Dispense Refill   ? albuterol sulfate (PROAIR HFA) 90 mcg/actuation aerosol inhaler Inhale 1-2 puffs by mouth into the lungs every 6 hours as needed.     ? ascorbic acid (VITAMIN C) 500 mg tablet Take 1 tablet by mouth twice daily.     ? cholecalciferol (VITAMIN D-3) 1,000 units tablet Take 1,000 Units by mouth daily.     ? Cyanocobalamin-Cobamamide (B12) 5,000-100 mcg lozg Place  under tongue.     ? docusate (COLACE) 100 mg capsule Take one capsule by mouth twice daily as needed for Constipation. 60 capsule 0   ? duloxetine DR (CYMBALTA) 60 mg capsule Take 60 mg by mouth twice daily.     ? estradiol cypionate(+) (DEPO-ESTRADIOL) 5 mg/mL oil Inject 0.4 mg into the muscle. Every 19 days      ? famotidine (PEPCID) 20 mg tablet Take 20 mg by mouth twice daily.     ? fexofenadine(+) (ALLEGRA) 180 mg tablet Take 180 mg by mouth at bedtime daily.     ? fluoxetine (PROZAC) 20 mg capsule Take 20 mg by mouth daily.     ? MAGNESIUM PO Take 1 tablet by mouth daily. 100mg      ? methocarbamoL (ROBAXIN) 750 mg tablet Take one tablet by mouth three times daily as needed for Spasms. 90 tablet 2   ? Multivitamins with Fluoride (MULTI-VITAMIN PO) Take  by mouth.     ? MYRBETRIQ 50 mg tablet Take 50 mg by mouth daily.     ? nortriptyline (PAMELOR) 25 mg capsule Take 25 mg by mouth  at bedtime daily.  1   ? omeprazole DR (PRILOSEC) 20 mg capsule Take 20 mg by mouth twice daily.     ? other medication [facility] [employer]astic Multivitamin 1 tablet by mouth twice daily     ? Potassium 99 mg tab Take  by mouth.     ? traMADol (ULTRAM) 50 mg tablet Take one tablet by mouth every 4 hours as needed. 50 tablet 1   ? ultrasonic cleaner,holding sol (POWER CLEANER MISC) Use 400 mg as directed.     ? zinc sulfate 220 mg (50 mg elemental zinc) capsule Take 220 mg by mouth daily.       No current facility-administered medications on file prior to visit.         family history includes Cancer in her maternal aunt and maternal uncle; Coronary Artery Disease in her father; Diabetes in her father; Heart problem in her father; Other in her maternal grandfather; Transient Ischaemic Attack in her father.      Social History     Socioeconomic History   ? Marital status: Married     Spouse name: Not on file   ? Number of children: Not on file   ? Years of education: Not on file   ? Highest education level: Not on file   Occupational History   ? Not on file   Tobacco Use   ? Smoking status: Never Smoker   ? Smokeless tobacco: Never Used   Substance and Sexual Activity   ? Alcohol use: No   ? Drug use: No   ? Sexual activity: Not on file   Other Topics Concern   ? Not on file   Social History Narrative   ? Not on file               Review of Systems   Musculoskeletal: Positive for back pain, myalgias and neck pain.   Neurological: Positive for syncope.   All other systems reviewed and are negative.        PHYSICAL EXAM:    Vitals:    01/08/20 0921   BP: 136/87   BP Source: Arm, Left Upper   Patient Position: Sitting   Pulse: 89   Resp: 20   Temp: 36.8 ?C (98.3 ?F)   SpO2: 97%   Weight: 72.6 kg (160 lb)   Height: 157.5 cm (62)   PainSc: Eight     Oswestry Total Score:: 42  Pain Score: Eight  Body mass index is 29.26 kg/m?Marland Kitchen    General: Alert, cooperative, no acute distress.  HEENT: Normocephalic, atraumatic.  Neck: Supple.  Lungs: Unlabored respirations, bilateral and equal chest excursion.  Heart: Regular rate.  Skin: Warm and dry to touch.  Abdomen: Nondistended.    Cervical spine:  Cervical tenderness: Yes lower neck  Pain with extension: No  Pain with lateral flexion: None  Limited neck ROM: No  Sensation to light touch: Intact and equal in the bilateral upper extremities  Hoffman sign: Negative bilaterally    Upper extremity strength:   Root Right Left   Shoulder Abduction C5 5 5   Elbow Flexion C5 5 5   Elbow Extension C7 5 5   Wrist Extension C6 5 5   Finger Flexion C8 5 5   Finger Abduction T1 5 5     Upper extremity reflexes:   Right Left   Biceps 2/4 2/4   Triceps 2/4 2/4   Brachioradialis 2/4 2/4     Thoracic spine: There  is mid thoracic tenderness at approximately the T8-T10 levels.  Tenderness is most prominent in bilateral paraspinal muscles.  There is slight increased tone in this region compared to the surrounding spinal levels.  There is no allodynia or skin discoloration in the region.    Neurological: Alert and oriented x3.       RADIOGRAPHIC EVALUATION:    MRI C-Spine Results:    Results for orders placed during the hospital encounter of 06/02/18    MRI C-SPINE WO CONTRAST    Narrative  EXAM: MRI C-SPINE    HISTORY:    30 old female, cervical stenosis    Technique: Multiple sagittal and axial MR sequences were obtained of the cervical spine.    Comparison: C-spine radiograph May 23, 2018 and MRI C-spine December 09, 2017    FINDINGS:    Prior C6-C7 ACDF. Trace anterolisthesis of C2-C3. The vertebral body heights are maintained. There is normal marrow signal.  The cervical cord is normal in size and signal. The paraspinous soft tissues are unremarkable.    C2-C3: Trace anterolisthesis. Asymmetric right facet arthropathy. Mild right foraminal stenosis. No central stenosis or left foraminal stenosis    C3-C4: Asymmetric left uncovertebral joint enlargement and left facet arthropathy. Moderate left foraminal narrowing.    C4-C5: Uncovertebral and facet joint hypertrophy resulting in mild bilateral foraminal stenosis. No significant central spinal stenosis.    C5-C6: Mild disc osteophyte complex and uncovertebral joint enlargement greater on the left. Marked asymmetric left facet arthropathy. Mild bilateral foraminal stenosis, greater on the left.    C6-C7: Fusion across the disc space. There is asymmetric left uncovertebral joint enlargement and osteophyte formation resulting in marked left foraminal stenosis.    C7-T1: No significant spinal or neuroforaminal stenosis.    Impression  1.  Prior ACDF of C6-C7. Residual left uncovertebral joint enlargement and asymmetric osteophyte formation results in marked left foraminal stenosis at this level.  2.  Additional multiple levels with mild foraminal stenosis as discussed above. Moderate narrowing on the left at C3-4.  3.  No abnormal cord signal    By my electronic signature, I attest that I have personally reviewed the images for this examination and formulated the interpretations and opinions expressed in this report      Finalized by Marily Memos, M.D. on 06/02/2018 2:25 PM. Dictated by Delfin Gant, M.D. on 06/02/2018 2:04 PM.    MRI thoracic spine from 2019.  I reviewed these images.  There are no abnormalities.       IMPRESSION:    1. Cervical spondylosis without myelopathy    2. Myofascial pain    3. Chronic midline thoracic back pain          PLAN:   GERARDINE BAND has ongoing left-sided neck pain with radiation to the left shoulder.  She did not respond epidural steroid injection.  She does have findings of facet arthropathy, and this is her next most likely pain generator.  I recommended left medial branch blocks for the C5-7 facet joints.  If she does well with these, she would be a good candidate for radiofrequency ablation for more prolonged pain relief.    Her midthoracic back pain is primarily myofascial in nature.  Her MRI 2 years ago was normal and I do not see any neurologic abnormalities today.  I will schedule trigger point injection for the bilateral paraspinal muscles in the mid thoracic spine.  Some of this pain may be related to her large breast size.  She tells me she is already seen a Engineer, petroleum who did not recommend breast reduction because she did not have shoulder indentations from her bra straps.

## 2020-01-08 NOTE — Patient Instructions
It was nice to see you today. Thank you for choosing to visit our clinic.       Your time is important and if you had to wait today, we do apologize. Our goal is to run exactly on time; however, on occasion, we get behind in clinic due to unexpected patient issues. Thank you for your patience.    General Instructions:  ? How to reach me: Please send a MyChart message to the [facility] or leave a voicemail for my nurse, Melissa, at [phone]  ? How to get a medication refill: Please use the MyChart Refill request or contact your pharmacy directly to request medication refills. We do not do same day refills on controlled substances.  ? How to receive your test results: If you have signed up for MyChart, you will receive your test results and messages from me this way. Otherwise, you will get a phone call or letter. If you are expecting results and have not heard from my office within 2 weeks of your testing, please send a MyChart message or call my office.  ? Scheduling: Our scheduling phone number is [phone].  ? Appointment Reminders on your cell phone: Make sure we have your cell phone number, and Text [facility] to [phone].  ? Support for many chronic illnesses is available through [facility]: [facility] or [phone].  ? For questions on nights, weekends or holidays, call the Operator at [phone], and ask for the doctor on call for Anesthesia Pain.      Again, thank you for coming in today.    _________________________________________________________________________________________________________

## 2020-01-16 ENCOUNTER — Encounter: Admit: 2020-01-16 | Discharge: 2020-01-16 | Payer: BC Managed Care – HMO

## 2020-01-16 ENCOUNTER — Emergency Department
Admit: 2020-01-16 | Discharge: 2020-01-16 | Disposition: A | Discharge status: Home / Self Care | Payer: BC Managed Care – HMO

## 2020-01-16 DIAGNOSIS — T753XXA Motion sickness, initial encounter: Secondary | ICD-10-CM

## 2020-01-16 DIAGNOSIS — N3000 Acute cystitis without hematuria: Secondary | ICD-10-CM

## 2020-01-16 DIAGNOSIS — G43909 Migraine, unspecified, not intractable, without status migrainosus: Secondary | ICD-10-CM

## 2020-01-16 DIAGNOSIS — R4189 Other symptoms and signs involving cognitive functions and awareness: Secondary | ICD-10-CM

## 2020-01-16 DIAGNOSIS — N200 Calculus of kidney: Secondary | ICD-10-CM

## 2020-01-16 DIAGNOSIS — R55 Syncope and collapse: Secondary | ICD-10-CM

## 2020-01-16 DIAGNOSIS — G4730 Sleep apnea, unspecified: Secondary | ICD-10-CM

## 2020-01-16 DIAGNOSIS — M797 Fibromyalgia: Secondary | ICD-10-CM

## 2020-01-16 DIAGNOSIS — J45909 Unspecified asthma, uncomplicated: Secondary | ICD-10-CM

## 2020-01-16 DIAGNOSIS — R0902 Hypoxemia: Secondary | ICD-10-CM

## 2020-01-16 LAB — COMPREHENSIVE METABOLIC PANEL
Lab: 0.7 mg/dL (ref 0.3–1.2)
Lab: 0.9 mg/dL (ref 0.4–1.00)
Lab: 102 MMOL/L — ABNORMAL HIGH (ref 98–110)
Lab: 11 10*3/uL — ABNORMAL HIGH (ref 3–12)
Lab: 137 MMOL/L — ABNORMAL HIGH (ref 137–147)
Lab: 19 mg/dL (ref 7–25)
Lab: 23 U/L (ref 7–56)
Lab: 24 MMOL/L (ref 21–30)
Lab: 33 U/L (ref 7–40)
Lab: 4.6 g/dL — ABNORMAL HIGH (ref 3.5–5.0)
Lab: 66 U/L — ABNORMAL LOW (ref 25–110)
Lab: 7.6 g/dL (ref 6.0–8.0)
Lab: 88 mg/dL (ref 70–100)
Lab: 9.7 mg/dL (ref 8.5–10.6)
Lab: >60 mL/min (ref >60)
Lab: >60 mL/min — ABNORMAL HIGH (ref >60)

## 2020-01-16 LAB — TSH WITH FREE T4 REFLEX: Lab: 2.1 uU/mL (ref 0.35–5.00)

## 2020-01-16 LAB — URINALYSIS DIPSTICK REFLEX TO CULTURE
Lab: NEGATIVE
Lab: NEGATIVE
Lab: NEGATIVE
Lab: POSITIVE — AB

## 2020-01-16 LAB — CBC AND DIFF
Lab: 0.1 10*3/uL (ref 0–0.45)
Lab: 12 10*3/uL — ABNORMAL HIGH (ref 4.5–11.0)
Lab: 17 (ref <20.7)

## 2020-01-16 LAB — URINALYSIS MICROSCOPIC REFLEX TO CULTURE

## 2020-01-16 LAB — POC TROPONIN: Lab: 0 ng/mL (ref 0.00–0.05)

## 2020-01-16 LAB — MAGNESIUM: Lab: 2.4 mg/dL — ABNORMAL HIGH (ref 1.6–2.6)

## 2020-01-16 LAB — POC LACTATE: Lab: 1.5 MMOL/L (ref 0.5–2.0)

## 2020-01-16 LAB — POC GLUCOSE: Lab: 98 mg/dL (ref 70–100)

## 2020-01-16 MED ORDER — CEPHALEXIN 500 MG PO CAP
500 mg | ORAL_CAPSULE | Freq: Four times a day (QID) | ORAL | 0 refills | Status: AC
Start: 2020-01-16 — End: ?
  Filled 2020-01-16: qty 20, 5d supply, fill #1

## 2020-01-16 MED ORDER — LACTATED RINGERS IV SOLP
1000 mL | INTRAVENOUS | 0 refills | Status: CP
Start: 2020-01-16 — End: ?
  Administered 2020-01-16: 19:00:00 1000 mL via INTRAVENOUS

## 2020-01-16 NOTE — ED Notes
Pt given discharge instructions and Rx and voices no concerns at this time. Pt helped into wheelchair and taken out to pharmacy to pick up medications.

## 2020-01-16 NOTE — ED Provider Notes
Ashley Gaines is a 54 y.o. female.    Chief Complaint:  Chief Complaint   Patient presents with   ? Hypotension     Pt states that she has a condition which causes her B/P to drop, causing her to become dizzy, and sometimes pass out.        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    Patient had syncopal episode at home today while sitting on her toilet.  She had just finished having a bowel movement became lightheaded and dizzy which she describes as feeling drunk and lost consciousness. No hearing/vision changes. Husband was home and this happened patient slumped over.  She did not hit her head.  She was unconscious for less than 1 minute and when she woke up was slightly confused, she did not know where she was, which quickly improved with re orientation.  No bowel or bladder incontinence.  Denies head trauma.    This is her sixth syncopal episode with the first 1 starting in September 2020. All episodes are similar with lightheadedness being the predominant presyncope symptom. Since this time she has had significant work-up with neurology and cardiology.  She follows with a cardiologist in [facility] was performed echo, stress test, and event monitor - patient reports all were normal. She follows with neurology at [facility] for this as well, her workup includes EEG, tilt table, neck CTA, all were non-diagnostic for her syncope.    Patient was supposed to have a root canal this morning for a painful tooth. She reports she has had decreased PO intake over the past several days due to the pain. Normally she will attempt to drink a gallon of water to ensure adequate hydration. She tries to intake mores salt via diet changes. She does reports she pees often, nearly every hour. No dysuria. She was taking penicillin for the past few days and has been having diarrhea for the past 2 days.          Review of Systems:  Review of Systems   Constitutional: Negative for chills, fatigue and fever.   HENT: Negative for sore throat.    Eyes: Negative for visual disturbance.   Respiratory: Negative for shortness of breath.    Cardiovascular: Negative for chest pain.   Gastrointestinal: Positive for diarrhea. Negative for abdominal distention, abdominal pain, constipation, nausea and vomiting.   Endocrine: Positive for polyuria.   Genitourinary: Positive for frequency. Negative for difficulty urinating, dysuria, hematuria and urgency.   Musculoskeletal: Positive for neck pain. Negative for back pain.   Skin: Negative for rash and wound.   Allergic/Immunologic: Positive for environmental allergies and food allergies.   Neurological: Positive for dizziness, syncope and weakness. Negative for seizures and headaches.   Psychiatric/Behavioral: Negative for confusion.       Allergies:  Adhesive tape (rosins), Bee [venom-honey bee], Latex, Succinylcholine, Benadryl [diphenhydramine hcl], Chromium, Codeine, Hydrocodone, Metoclopramide, Morphine, and Oxycodone    Past Medical History:  Medical History:   Diagnosis Date   ? Asthma     allergies and exercise induced   ? Cognitive impairment since 08/2008    /memory; most likely 2/2 adjustment reaction with depressive sx's as  she had a prolonged period of illness after sx   ? Fibromyalgia    ? Hypoxia 2011    post procedural pain medicine   ? Kidney stones 1984   ?  Migraines    ? Motion sickness    ? Sleep apnea     uses CPAP machine       Past Surgical History:  Surgical History:   Procedure Laterality Date   ? APPENDECTOMY  1983   ? HX HYSTERECTOMY  1984   ? CHOLECYSTECTOMY  2002   ? CARPAL TUNNEL RELEASE Bilateral 2004   ? HAND SURGERY Left 2004    thumb   ? LYSIS OF ADHESIONS  2011    abdominal adhesiolysis surgery   ? KNEE SURGERY Left 2014    meniscus repair   ? KIDNEY STONE SURGERY  05/2017    7mm stone removed   ? RIGHT ACHILLES TAKEDOWN AND REPAIR Right 05/18/2017    Performed by Tanja Port, MD at [facility]   ? REPAIR ACHILLES TENDON Left 12/22/2017    Performed by Vopat, Lowry Ram, MD at [facility]   ? PLANTAR FASCIA RELEASE Left 12/22/2017    Performed by Vopat, Lowry Ram, MD at [facility]   ? FUSION SPINE ANTERIOR WITH DISCECTOMY/ DECOMPRESSION - CERVICAL 6-7 WITH ANTERIOR SPINAL INSTRUMENTATION Right 03/06/2018    Performed by Addison Lank, MD at [facility]   ? MICROSURGICAL TECHNIQUES - REQUIRING OPERATING MICROSCOPE USE N/A 03/06/2018    Performed by Addison Lank, MD at [facility]   ? COLONOSCOPY     ? GASTRIC BYPASS      12/2017   ? HYSTEROSCOPY      endometriosis   ? SHOULDER SURGERY Bilateral 2003, 2004       Pertinent medical/surgical history reviewed    Social History:  Social History     Tobacco Use   ? Smoking status: Never Smoker   ? Smokeless tobacco: Never Used   Substance Use Topics   ? Alcohol use: No   ? Drug use: No     Social History     Substance and Sexual Activity   Drug Use No             Family History:  Family History   Problem Relation Age of Onset   ? Coronary Artery Disease Father    ? Diabetes Father    ? Transient Ischaemic Attack Father    ? Heart problem Father    ? Other Maternal Grandfather         memory decline at an old age   ? Cancer Maternal Aunt    ? Cancer Maternal Uncle        Vitals:  ED Vitals    Date and Time T BP P RR SPO2P SPO2 User   01/16/20 1517 -- 99/72 98 -- -- -- OK   01/16/20 1516 -- 112/78 93 -- -- -- OK   01/16/20 1515 -- 122/74 95 -- -- -- OK   01/16/20 1430 -- 115/57 99 17 PER MINUTE 97 95 % OK   01/16/20 1330 -- 117/72 93 17 PER MINUTE 92 97 % EH   01/16/20 1300 -- 113/69 92 14 PER MINUTE 92 99 % EH   01/16/20 1213 36.8 ?C (98.2 ?F) 99/59 -- -- 94 96 % KT          Physical Exam:  Physical Exam  Vitals and nursing note reviewed.   Constitutional:       General: She is not in acute distress.     Appearance: Normal appearance. She is normal weight.   HENT:      Head: Normocephalic and atraumatic.  Mouth/Throat: Mouth: Mucous membranes are dry.   Eyes:      Extraocular Movements: Extraocular movements intact.      Conjunctiva/sclera: Conjunctivae normal.   Cardiovascular:      Rate and Rhythm: Normal rate and regular rhythm.      Heart sounds: Normal heart sounds. No murmur heard.     Pulmonary:      Effort: Pulmonary effort is normal. No respiratory distress.      Breath sounds: Normal breath sounds.   Abdominal:      General: Abdomen is flat. Bowel sounds are normal. There is no distension.      Palpations: Abdomen is soft.      Tenderness: There is no abdominal tenderness.   Musculoskeletal:         General: No swelling. Normal range of motion.      Cervical back: Normal range of motion and neck supple.   Skin:     General: Skin is warm and dry.   Neurological:      General: No focal deficit present.      Mental Status: She is alert and oriented to person, place, and time. Mental status is at baseline.      Cranial Nerves: No cranial nerve deficit.      Motor: No weakness.   Psychiatric:         Mood and Affect: Mood normal.         Behavior: Behavior normal.         Laboratory Results:  Labs Reviewed   CBC AND DIFF - Abnormal       Result Value Ref Range Status    White Blood Cells 12.4 (*) 4.5 - 11.0 K/UL Final    RBC 5.42 (*) 4.0 - 5.0 M/UL Final    Hemoglobin 16.4 (*) 12.0 - 15.0 GM/DL Final    Hematocrit 16.1 (*) 36 - 45 % Final    MCV 93.0  80 - 100 FL Final    MCH 30.2  26 - 34 PG Final    MCHC 32.4  32.0 - 36.0 G/DL Final    RDW 09.6  11 - 15 % Final    Platelet Count 331  150 - 400 K/UL Final    MPV 7.0  7 - 11 FL Final    Neutrophils 80 (*) 41 - 77 % Final    Lymphocytes 10 (*) 24 - 44 % Final    Monocytes 9  4 - 12 % Final    Eosinophils 1  0 - 5 % Final    Basophils 0  0 - 2 % Final    Absolute Neutrophil Count 9.96 (*) 1.8 - 7.0 K/UL Final    Absolute Lymph Count 1.20  1.0 - 4.8 K/UL Final    Absolute Monocyte Count 1.08 (*) 0 - 0.80 K/UL Final    Absolute Eosinophil Count 0.10  0 - 0.45 K/UL Final Absolute Basophil Count 0.03  0 - 0.20 K/UL Final    MDW (Monocyte Distribution Width) 17.8  <20.7 Final   URINALYSIS DIPSTICK REFLEX TO CULTURE - Abnormal    Color,UA YELLOW   Final    Turbidity,UA CLEAR  CLEAR-CLEAR Final    Specific Gravity-Urine 1.019  1.005 - 1.030 Final    pH,UA 5.0  5.0 - 8.0 Final    Protein,UA 1+ (*) NEG-NEG Final    Glucose,UA NEG  NEG-NEG Final    Ketones,UA 1+ (*) NEG-NEG Final    Bilirubin,UA NEG  NEG-NEG Final  Blood,UA NEG  NEG-NEG Final    Urobilinogen,UA NORMAL  NORM-NORMAL Final    Nitrite,UA NEG  NEG-NEG Final    Leukocytes,UA NEG  NEG-NEG Final    Urine Ascorbic Acid, UA POS (*) NEG-NEG Final   URINALYSIS MICROSCOPIC REFLEX TO CULTURE - Abnormal    WBCs,UA 2-10  0 - 2 /HPF Final    RBCs,UA 0-2  0 - 3 /HPF Final    Comment,UA     Final    Value: Criteria for reflex to culture are WBC>10, Positive Nitrite, and/or >=+1   leukocytes. If quantity is not sufficient, an addendum will follow.      MucousUA TRACE   Final    Bacteria,UA MODERATE (*) NEG-NEG Final    Squamous Epithelial Cells 2-5  0 - 5 Final    Hyaline Cast 2-5   Final   CULTURE-BLOOD W/SENSITIVITY   CULTURE-BLOOD W/SENSITIVITY   COMPREHENSIVE METABOLIC PANEL    Sodium 137  132 - 147 MMOL/L Final    Potassium 4.5  3.5 - 5.1 MMOL/L Final    Chloride 102  98 - 110 MMOL/L Final    Glucose 88  70 - 100 MG/DL Final    Blood Urea Nitrogen 19  7 - 25 MG/DL Final    Creatinine 4.40  0.4 - 1.00 MG/DL Final    Calcium 9.7  8.5 - 10.6 MG/DL Final    Total Protein 7.6  6.0 - 8.0 G/DL Final    Total Bilirubin 0.7  0.3 - 1.2 MG/DL Final    Albumin 4.6  3.5 - 5.0 G/DL Final    Alk Phosphatase 66  25 - 110 U/L Final    AST (SGOT) 33  7 - 40 U/L Final    CO2 24  21 - 30 MMOL/L Final    ALT (SGPT) 23  7 - 56 U/L Final    Anion Gap 11  3 - 12 Final    eGFR Non African American >60  >60 mL/min Final    eGFR African American >60  >60 mL/min Final   MAGNESIUM    Magnesium 2.4  1.6 - 2.6 mg/dL Final   TSH WITH FREE T4 REFLEX    TSH 2.11  0.35 - 5.00 MCU/ML Final   POC GLUCOSE    Glucose, POC 98  70 - 100 MG/DL Final   POC TROPONIN    Troponin-I-POC 0.01  0.00 - 0.05 NG/ML Final   POC LACTATE    LACTIC ACID POC 1.5  0.5 - 2.0 MMOL/L Final   URINE CLEAR TOP TUBE   UA REFLEX LABEL   POC TROPONIN   POC GLUCOSE   POC LACTATE   POC LACTATE   POC LACTATE     POC Glucose (Download): 98  Urine Pregnancy  Urine Pregnancy: Negative  QC: Acceptable  Urine Pregnancy Lot #: LOT: [id number] EXP:2021-06-12    Radiology Interpretation:    No orders to display           ED Course:  Patient seen today for a chief complaint of sycnope  Patient evaluated by resident and attending  Available records reviewed.   Patient's vitals were reviewed.   Lab workup showed leukocytosis and urine studies consistent with UTI  Prescribed 5 days of Kelfex  Syncope workup has been extremely thorough, see above, negative orthostatics in ED             ED Scoring:  Coding    Facility Administered Meds:  Medications   lactated ringers infusion (0 mL Intravenous Infusion Stopped 01/16/20 1441)         Clinical Impression:  Clinical Impression   Acute cystitis without hematuria   Syncope and collapse       Disposition/Follow up  ED Disposition     ED Disposition    Discharge        Nicole Cella, MD  [address]  [place] [address]  [phone]    In 1 week        Medications:  New Prescriptions    CEPHALEXIN (KEFLEX) 500 MG CAPSULE    Take one capsule by mouth four times daily for 5 days.       Procedure Notes:  Procedures        Attestation / Supervision:        Ezzie Dural, MD

## 2020-01-16 NOTE — ED Notes
Pt to ED36 w/ CC of low blood pressure. Pts husband states that she passed out this AM at 10 on the toilet, event was witnessed pt did not hit head. Pt husband states that she was limp and clammy at the time, he lifted pts legs over her head and checked BP which was 90s/50s at the time. Pt reports multiple syncopal episodes over the past year and has seen multiple specialities w/ no relief. Pt reports ability to know when episodes are coming on, reports feeling clammy and dizzy prior to passing out. Pt assisted into ED cart and connected to all monitors. Pt reports taking penicillin r/t dental procedure that was supposed to happen today. Pt endorses diarrhea r/t abx use.    Medical History:   Diagnosis Date   ? Asthma     allergies and exercise induced   ? Cognitive impairment since 08/2008    /memory; most likely 2/2 adjustment reaction with depressive sx's as  she had a prolonged period of illness after sx   ? Fibromyalgia    ? Hypoxia 2011    post procedural pain medicine   ? Kidney stones 1984   ? Migraines    ? Motion sickness    ? Sleep apnea     uses CPAP machine     Belongings: glasses, mask, shirt, tank top, pants, socks, shoes, jacket. All belongings left at bedside w/ patient.

## 2020-01-31 ENCOUNTER — Encounter: Admit: 2020-01-31 | Discharge: 2020-01-31 | Payer: BC Managed Care – HMO

## 2020-02-05 ENCOUNTER — Encounter: Admit: 2020-02-05 | Discharge: 2020-02-05 | Payer: BC Managed Care – HMO

## 2020-02-25 ENCOUNTER — Encounter: Admit: 2020-02-25 | Discharge: 2020-02-25 | Payer: BC Managed Care – HMO

## 2020-02-26 ENCOUNTER — Encounter: Admit: 2020-02-26 | Discharge: 2020-02-26 | Payer: BC Managed Care – HMO

## 2020-02-26 ENCOUNTER — Ambulatory Visit: Admit: 2020-02-26 | Discharge: 2020-02-26 | Payer: BC Managed Care – HMO

## 2020-02-26 DIAGNOSIS — R0902 Hypoxemia: Secondary | ICD-10-CM

## 2020-02-26 DIAGNOSIS — T753XXA Motion sickness, initial encounter: Secondary | ICD-10-CM

## 2020-02-26 DIAGNOSIS — M797 Fibromyalgia: Secondary | ICD-10-CM

## 2020-02-26 DIAGNOSIS — M47812 Spondylosis without myelopathy or radiculopathy, cervical region: Secondary | ICD-10-CM

## 2020-02-26 DIAGNOSIS — G4730 Sleep apnea, unspecified: Secondary | ICD-10-CM

## 2020-02-26 DIAGNOSIS — R4189 Other symptoms and signs involving cognitive functions and awareness: Secondary | ICD-10-CM

## 2020-02-26 DIAGNOSIS — M546 Pain in thoracic spine: Secondary | ICD-10-CM

## 2020-02-26 DIAGNOSIS — M7918 Myalgia, other site: Secondary | ICD-10-CM

## 2020-02-26 DIAGNOSIS — G43909 Migraine, unspecified, not intractable, without status migrainosus: Secondary | ICD-10-CM

## 2020-02-26 DIAGNOSIS — N200 Calculus of kidney: Secondary | ICD-10-CM

## 2020-02-26 DIAGNOSIS — J45909 Unspecified asthma, uncomplicated: Secondary | ICD-10-CM

## 2020-02-26 MED ORDER — BUPIVACAINE (PF) 0.5 % (5 MG/ML) IJ SOLN
3 mL | Freq: Once | INTRAMUSCULAR | 0 refills | Status: CP
Start: 2020-02-26 — End: ?
  Administered 2020-02-26: 21:00:00 3 mL via INTRAMUSCULAR

## 2020-02-26 NOTE — Procedures
Attending Surgeon: Philomena Course, MD    Anesthesia: Local    Pre-Procedure Diagnosis:   1. Cervical spondylosis without myelopathy    2. Myofascial pain    3. Chronic midline thoracic back pain        Post-Procedure Diagnosis:   1. Cervical spondylosis without myelopathy    2. Myofascial pain    3. Chronic midline thoracic back pain        [place] [facility] TRIG[facility] POINT INJECTION  Locations: R thoracic and L thoracic  Consent:   Consent obtained: written  Consent given by: patient    Discussed with patient the purpose of the treatment/procedure, other ways of treating my condition, including no treatment/ procedure and the risks and benefits of the alternatives. Patient has decided to proceed with treatment/procedure.        Universal Protocol:  Relevant documents: relevant documents present and verified  Test results: test results available and properly labeled  Imaging studies: imaging studies available  Required items: required blood products, implants, devices, and special equipment available  Site marked: the operative site was marked  Patient identity confirmed: Patient identify confirmed verbally with patient.        Time out: Immediately prior to procedure a "time out" was called to verify the correct patient, procedure, equipment, support staff and site/side marked as required      Procedures Details:   Indications: painPrep: alcohol  Needle size: 27 G  Number of muscles: 1 or 2  Approach: posterior  Patient tolerance: Patient tolerated the procedure well with no immediate complications. Pressure was applied, and hemostasis was accomplished.  Comments: 62ml bupivacaine 0.5% injected into the right thoracic paraspinal muscle at T10. Repeated on the left.        Estimated blood loss: none or minimal  Specimens: none  Patient tolerated the procedure well with no immediate complications. Pressure was applied, and hemostasis was accomplished.

## 2020-02-26 NOTE — Patient Instructions
Discharge Instructions for [facility]ial [facility]    Important information following your procedure today: You may drive today    This injection is for diagnostic purposes, it is a test. Only short term results are expected.    1. Though the procedure is generally safe and complications are rare, we do ask that you be aware of any of the following:   ? Any swelling, persistent redness, new bleeding, or drainage from the site of the injection.  ? You should not experience a severe headache.  ? You should not run a fever over 101? F.  ? New onset of sharp, severe back & or neck pain.  ? New onset of upper or lower extremity numbness or weakness.  ? New difficulty controlling bowel or bladder function after the injection.  ? New shortness of breath.    If any of these occur, please call to report this occurrence to Dr. Evelena Leyden at [phone]. If you are calling after 4:00 p.m. or on weekends or holidays please call [phone] and ask to have the resident physician on call for the physician paged or go to your local emergency room.  2. You may experience soreness at the injection site. Ice can be applied at 20 minute intervals. Avoid application of direct heat, hot showers or hot tubs today.  3. Patients taking a daily blood thinner can resume their regular dose this evening.  4. It is important that you take all medications ordered by your pain physician. Taking medication as ordered is an important part of your pain care plan. If you cannot continue the medication plan, please notify the physician.   5. Remain active today. Do the activities that would normally cause you pain.  6. It is important for you to keep track of the results of this test on paper.  ? Did you get relief?  ? Percentage of relief?  ? How long did it last?  Call back to report the results to a nurse TOMORROW at Dr. Evelena Leyden at [phone]. Use notes that you kept when giving your report. You may have to leave a message and a nurse will contact you to help you determine if you are a candidate for the Radiofrequency Ablation Procedure.    The following medications were used: Bupivicaine      PAIN DIARY  Please report pain on 0-10 scale for each hour listed following discharge.  (0 = No pain; 5 = Moderate pain; 10 = Worst pain of your life)  TIME DAY OF PROCEDURE LOCATION OF PAIN   Pain Level  Prior to Procedure     9:00 AM     10:00     11:00     12:00 (NOON)     1:00 PM     2:00     3:00     4:00     5:00     6:00     7:00     8:00     9:00     10:00     11:00 PM     12:00 AM (MIDNIGHT)              If you are unable to keep your upcoming appointment, please notify the [facility] scheduler at [phone] at least 24 hours in advance.

## 2020-02-26 NOTE — Progress Notes
[facility] HISTORY AND PHYSICAL         HISTORY OF PRESENT ILLNESS:      Ashley Gaines returns today for pain in the left side of the neck radiating to the left shoulder.  She recently had a cervical epidural steroid injection.  Unfortunately, she has not noticed any relief with this.  Her pain has not changed compared to her description in the previous HPI detailed below.    She also mentions another area of pain she would like to have evaluated.  This is in the mid back.  It is just above and below her bra strap, immediately below the shoulder blades.  This pain has been present for several years and gradually worsening.  There is no inciting event.  The pain is mostly in the middle of the back but occasionally radiates to the bilateral posterior chest.  It does not radiate to the anterior chest.  She denies any numbness or tingling in the region.  It is worse when she wears a bra and better when she removes her bra.  She had MRI of the thoracic spine in 2019 to evaluate this.  This was normal.  She has tried the medications listed below which have not helped with this type of pain.  She has never had any interventions to the thoracic spine.  She wonders if some of her thoracic pain is related to strain from wearing a bra with large breasts.        11/20/19:  Ashley Gaines is referred today by neurosurgeon Dr. Earlene Plater for neck and left shoulder pain.  This pain started after a C6-7 ACDF in 2019.  She did not have any similar pain before hand.  It is primarily in the base of the left neck and in the left posterior upper trapezius area.  It is a constant sharp and burning sensation.  It is worse with shoulder movements.  Sometimes it radiates very slightly distal to the shoulder but not any further into the arm.  She denies any numbness, tingling, or weakness in the bilateral upper extremities.  Her pain is worse when wearing a bra.  She has a history of left shoulder surgery several years ago but this pain is different.  Massage and physical therapy have not provided any long-term relief.  She has tried baclofen, duloxetine, and gabapentin without any relief.  Tramadol helps mildly.  Trigger point injections did not help on 2 different occasions.  She is also being evaluated for syncope as a separate problem.  She lives approximately [place] minutes from [facility].  She recently had plain films and MRI of the cervical spine.        Pain diagram:               Medical History:   Diagnosis Date   ? Asthma     allergies and exercise induced   ? Cognitive impairment since 08/2008    /memory; most likely 2/2 adjustment reaction with depressive sx's as  she had a prolonged period of illness after sx   ? Fibromyalgia    ? Hypoxia 2011    post procedural pain medicine   ? Kidney stones 1984   ? Migraines    ? Motion sickness    ? Sleep apnea     uses CPAP machine         Surgical History:   Procedure Laterality Date   ? APPENDECTOMY  1983   ? HX  HYSTERECTOMY  1984   ? CHOLECYSTECTOMY  2002   ? CARPAL TUNNEL RELEASE Bilateral 2004   ? HAND SURGERY Left 2004    thumb   ? LYSIS OF ADHESIONS  2011    abdominal adhesiolysis surgery   ? KNEE SURGERY Left 2014    meniscus repair   ? KIDNEY STONE SURGERY  05/2017    7mm stone removed   ? RIGHT ACHILLES TAKEDOWN AND REPAIR Right 05/18/2017    Performed by [facility]ja Port, [facility]   ? REPAIR ACHILLES TENDON Left 12/22/2017    Performed by Vopat, Lowry Ram, MD at [facility]   ? PLANTAR FASCIA RELEASE Left 12/22/2017    Performed by Vopat, Lowry Ram, MD at [facility]   ? FUSION SPINE ANTERIOR WITH DISCECTOMY/ DECOMPRESSION - CERVICAL 6-7 WITH ANTERIOR SPINAL INSTRUMENTATION Right 03/06/2018    Performed by Addison Lank, MD at [facility]   ? MICROSURGICAL TECHNIQUES - REQUIRING OPERATING MICROSCOPE USE N/A 03/06/2018    Performed by Addison Lank, MD at [facility]   ? COLONOSCOPY     ? GASTRIC BYPASS      12/2017   ? HYSTEROSCOPY      endometriosis   ? SHOULDER SURGERY Bilateral 2003, 2004 Allergies   Allergen Reactions   ? Adhesive Tape (Rosins) RASH   ? Bee [Venom-Honey Bee] EDEMA     carries epipen   ? Latex RASH   ? Succinylcholine MUSCLE PAIN   ? Benadryl [Diphenhydramine Hcl] SEE COMMENTS     restless leg syndrome   ? Chromium REDNESS   ? Codeine NAUSEA AND VOMITING   ? Hydrocodone ITCHING   ? Metoclopramide STOMACH UPSET   ? Morphine NAUSEA AND VOMITING   ? Oxycodone ITCHING     tolerates with antihistamine         Current Outpatient Medications on File Prior to Visit   Medication Sig Dispense Refill   ? albuterol sulfate (PROAIR HFA) 90 mcg/actuation aerosol inhaler Inhale 1-2 puffs by mouth into the lungs every 6 hours as needed.     ? ascorbic acid (VITAMIN C) 500 mg tablet Take 1 tablet by mouth twice daily.     ? cholecalciferol (VITAMIN D-3) 1,000 units tablet Take 1,000 Units by mouth daily.     ? Cyanocobalamin-Cobamamide (B12) 5,000-100 mcg lozg Place  under tongue.     ? docusate (COLACE) 100 mg capsule Take one capsule by mouth twice daily as needed for Constipation. 60 capsule 0   ? duloxetine DR (CYMBALTA) 60 mg capsule Take 60 mg by mouth twice daily.     ? estradiol cypionate(+) (DEPO-ESTRADIOL) 5 mg/mL oil Inject 0.4 mg into the muscle. Every 19 days      ? famotidine (PEPCID) 20 mg tablet Take 20 mg by mouth twice daily.     ? fexofenadine(+) (ALLEGRA) 180 mg tablet Take 180 mg by mouth at bedtime daily.     ? fluoxetine (PROZAC) 20 mg capsule Take 20 mg by mouth daily.     ? MAGNESIUM PO Take 1 tablet by mouth daily. 100mg      ? methocarbamoL (ROBAXIN) 750 mg tablet Take one tablet by mouth three times daily as needed for Spasms. 90 tablet 2   ? Multivitamins with Fluoride (MULTI-VITAMIN PO) Take  by mouth.     ? MYRBETRIQ 50 mg tablet Take 50 mg by mouth daily.     ? nortriptyline (PAMELOR) 25 mg capsule Take 25 mg by mouth at  bedtime daily.  1   ? omeprazole DR (PRILOSEC) 20 mg capsule Take 20 mg by mouth twice daily.     ? other medication Journey Bariastic Multivitamin 1 tablet by mouth twice daily     ? Potassium 99 mg tab Take  by mouth.     ? traMADol (ULTRAM) 50 mg tablet Take one tablet by mouth every 4 hours as needed. 50 tablet 1   ? ultrasonic cleaner,holding sol (POWER CLEANER MISC) Use 400 mg as directed.     ? zinc sulfate 220 mg (50 mg elemental zinc) capsule Take 220 mg by mouth daily.       No current facility-administered medications on file prior to visit.         family history includes Cancer in her maternal aunt and maternal uncle; Coronary Artery Disease in her father; Diabetes in her father; Heart problem in her father; Other in her maternal grandfather; Transient Ischaemic Attack in her father.      Social History     Socioeconomic History   ? Marital status: Married     Spouse name: Not on file   ? Number of children: Not on file   ? Years of education: Not on file   ? Highest education level: Not on file   Occupational History   ? Not on file   Tobacco Use   ? Smoking status: Never Smoker   ? Smokeless tobacco: Never Used   Substance and Sexual Activity   ? Alcohol use: No   ? Drug use: No   ? Sexual activity: Not on file   Other Topics Concern   ? Not on file   Social History Narrative   ? Not on file               Review of Systems   Musculoskeletal: Positive for back pain, myalgias and neck pain.   Neurological: Positive for syncope.   All other systems reviewed and are negative.        PHYSICAL EXAM:    There were no vitals filed for this visit.        There is no height or weight on file to calculate BMI.    General: Alert, cooperative, no acute distress.  HEENT: Normocephalic, atraumatic.  Neck: Supple.  Lungs: Unlabored respirations, bilateral and equal chest excursion.  Heart: Regular rate.  Skin: Warm and dry to touch.  Abdomen: Nondistended.    Cervical spine:  Cervical tenderness: Yes lower neck  Pain with extension: No  Pain with lateral flexion: None  Limited neck ROM: No  Sensation to light touch: Intact and equal in the bilateral upper extremities  Hoffman sign: Negative bilaterally    Upper extremity strength:   Root Right Left   Shoulder Abduction C5 5 5   Elbow Flexion C5 5 5   Elbow Extension C7 5 5   Wrist Extension C6 5 5   Finger Flexion C8 5 5   Finger Abduction T1 5 5     Upper extremity reflexes:   Right Left   Biceps 2/4 2/4   Triceps 2/4 2/4   Brachioradialis 2/4 2/4     Thoracic spine: There is mid thoracic tenderness at approximately the T8-T10 levels.  Tenderness is most prominent in bilateral paraspinal muscles.  There is slight increased tone in this region compared to the surrounding spinal levels.  There is no allodynia or skin discoloration in the region.    Neurological: Alert and oriented x3.  RADIOGRAPHIC EVALUATION:    MRI C-Spine Results:    Results for orders placed during the hospital encounter of 06/02/18    MRI C-SPINE WO CONTRAST    Narrative  EXAM: MRI C-SPINE    HISTORY:    63 old female, cervical stenosis    Technique: Multiple sagittal and axial MR sequences were obtained of the cervical spine.    Comparison: C-spine radiograph May 23, 2018 and MRI C-spine December 09, 2017    FINDINGS:    Prior C6-C7 ACDF. Trace anterolisthesis of C2-C3. The vertebral body heights are maintained. There is normal marrow signal.  The cervical cord is normal in size and signal. The paraspinous soft tissues are unremarkable.    C2-C3: Trace anterolisthesis. Asymmetric right facet arthropathy. Mild right foraminal stenosis. No central stenosis or left foraminal stenosis    C3-C4: Asymmetric left uncovertebral joint enlargement and left facet arthropathy. Moderate left foraminal narrowing.    C4-C5: Uncovertebral and facet joint hypertrophy resulting in mild bilateral foraminal stenosis. No significant central spinal stenosis.    C5-C6: Mild disc osteophyte complex and uncovertebral joint enlargement greater on the left. Marked asymmetric left facet arthropathy. Mild bilateral foraminal stenosis, greater on the left.    C6-C7: Fusion across the disc space. There is asymmetric left uncovertebral joint enlargement and osteophyte formation resulting in marked left foraminal stenosis.    C7-T1: No significant spinal or neuroforaminal stenosis.    Impression  1.  Prior ACDF of C6-C7. Residual left uncovertebral joint enlargement and asymmetric osteophyte formation results in marked left foraminal stenosis at this level.  2.  Additional multiple levels with mild foraminal stenosis as discussed above. Moderate narrowing on the left at C3-4.  3.  No abnormal cord signal    By my electronic signature, I attest that I have personally reviewed the images for this examination and formulated the interpretations and opinions expressed in this report      Finalized by Marily Memos, M.D. on 06/02/2018 2:25 PM. Dictated by Delfin Gant, M.D. on 06/02/2018 2:04 PM.    MRI thoracic spine from 2019.  I reviewed these images.  There are no abnormalities.       IMPRESSION:    1. Cervical spondylosis without myelopathy    2. Myofascial pain    3. Chronic midline thoracic back pain          PLAN:   Left C5-7 MBB #1  Thoracic TPI

## 2020-02-26 NOTE — Procedures
Attending Surgeon: Philomena Course, MD    Anesthesia: Local    Pre-Procedure Diagnosis:   1. Cervical spondylosis without myelopathy    2. Myofascial pain    3. Chronic midline thoracic back pain        Post-Procedure Diagnosis:   1. Cervical spondylosis without myelopathy    2. Myofascial pain    3. Chronic midline thoracic back pain        MBB CERV/THOR  Procedure: medial branch block    Laterality: left    Location: - C2, C3 and C4      Consent:   Consent obtained: written  Consent given by: patient    Discussed with patient the purpose of the treatment/procedure, other ways of treating my condition, including no treatment/ procedure and the risks and benefits of the alternatives. Patient has decided to proceed with treatment/procedure.        Universal Protocol:  Relevant documents: relevant documents present and verified  Test results: test results available and properly labeled  Imaging studies: imaging studies available  Required items: required blood products, implants, devices, and special equipment available  Site marked: the operative site was marked  Patient identity confirmed: Patient identify confirmed verbally with patient.        Time out: Immediately prior to procedure a time out was called to verify the correct patient, procedure, equipment, support staff and site/side marked as required      Procedures Details:   Indications: diagnostic evaluation   Prep: chlorhexidine  Patient position: prone  Estimated Blood Loss: minimal  Specimens: none  Number of Joints: 2  Guidance: fluoroscopy  Needle and Epidural Catheter: quincke  Needle size: 25 G  Injection procedure: Negative aspiration for blood  Amount Injected:   C2: 1mL  C3: 1mL  C4: 1mL  Patient tolerance: Patient tolerated the procedure well with no immediate complications. Pressure was applied, and hemostasis was accomplished.  Comments: 0.34ml of bupivacaine 0.5% injected at each of 3 locations to anesthetize the left C5-6 and C6-7 facet joints        Estimated blood loss: none or minimal  Specimens: none  Patient tolerated the procedure well with no immediate complications. Pressure was applied, and hemostasis was accomplished.

## 2020-02-26 NOTE — Progress Notes

## 2020-02-27 ENCOUNTER — Encounter: Admit: 2020-02-27 | Discharge: 2020-02-27 | Payer: BC Managed Care – HMO

## 2020-02-27 NOTE — Telephone Encounter
Patient calling to provide MBB # 1, Patient received 60% pain relief  for 6 hours

## 2020-03-11 ENCOUNTER — Encounter: Admit: 2020-03-11 | Discharge: 2020-03-11 | Payer: BC Managed Care – HMO

## 2020-03-11 ENCOUNTER — Ambulatory Visit: Admit: 2020-03-11 | Discharge: 2020-03-11 | Payer: BC Managed Care – HMO

## 2020-03-11 DIAGNOSIS — G4730 Sleep apnea, unspecified: Secondary | ICD-10-CM

## 2020-03-11 DIAGNOSIS — M797 Fibromyalgia: Secondary | ICD-10-CM

## 2020-03-11 DIAGNOSIS — R0902 Hypoxemia: Secondary | ICD-10-CM

## 2020-03-11 DIAGNOSIS — G43909 Migraine, unspecified, not intractable, without status migrainosus: Secondary | ICD-10-CM

## 2020-03-11 DIAGNOSIS — J45909 Unspecified asthma, uncomplicated: Secondary | ICD-10-CM

## 2020-03-11 DIAGNOSIS — R4189 Other symptoms and signs involving cognitive functions and awareness: Secondary | ICD-10-CM

## 2020-03-11 DIAGNOSIS — N200 Calculus of kidney: Secondary | ICD-10-CM

## 2020-03-11 DIAGNOSIS — T753XXA Motion sickness, initial encounter: Secondary | ICD-10-CM

## 2020-03-11 DIAGNOSIS — R55 Syncope and collapse: Secondary | ICD-10-CM

## 2020-03-11 MED ORDER — FLUDROCORTISONE 0.1 MG PO TAB
.1 mg | ORAL_TABLET | Freq: Every day | ORAL | 2 refills | 90.00000 days | Status: AC
Start: 2020-03-11 — End: ?

## 2020-03-11 NOTE — Progress Notes
Date of Service: 03/11/2020     Subjective:               Ashley Gaines is a 54 y.o. female.        History of Present Illness    Her last visit with me was in September 2021.  She presented with chief complaint of syncope.  I suspected neurocardiogenic syncope versus hemodynamic and doubted primary neurogenic syncope.    CT angiogram of the neck September 2021: No evidence of arterial vascular injury.  Mild cervical spondylosis and severe multilevel degenerative foraminal stenosis.  Prior ACDF C5-C6.  Small amount of calcific plaque left vertebral origin.  No significant narrowing of vertebral arteries and no dissection.    Tilt table test October 2021: She was conscious during whole study.  Dizziness occurred during procedure.  Overall negative study with no significant hemodynamic change.    EEG November 22, 2019 normal    She went to ER in November 2021.  She had a syncopal episode while sitting on her toilet.  She became lightheaded and dizzy with a sensation of feeling drunk and had loss of consciousness.  Lightheadedness was prodrome.    She had another episode since I last saw her where she was sitting at her kitchen table and then stood up and felt very dizzy.  When this happens, she tends to sit down and symptoms improved.    She has had no other neurologic complaints or concerns reported today.         Chief Complaint:  Chief Complaint   Patient presents with   ? Follow Up     syncope       Past Medical History:  Medical History:   Diagnosis Date   ? Asthma     allergies and exercise induced   ? Cognitive impairment since 08/2008    /memory; most likely 2/2 adjustment reaction with depressive sx's as  she had a prolonged period of illness after sx   ? Fibromyalgia    ? Hypoxia 2011    post procedural pain medicine   ? Kidney stones 1984   ? Migraines    ? Motion sickness    ? Sleep apnea     uses CPAP machine       Surgical History:  Surgical History:   Procedure Laterality Date   ? APPENDECTOMY  1983 ? HX HYSTERECTOMY  1984   ? CHOLECYSTECTOMY  2002   ? CARPAL TUNNEL RELEASE Bilateral 2004   ? HAND SURGERY Left 2004    thumb   ? LYSIS OF ADHESIONS  2011    abdominal adhesiolysis surgery   ? KNEE SURGERY Left 2014    meniscus repair   ? KIDNEY STONE SURGERY  05/2017    7mm stone removed   ? RIGHT ACHILLES TAKEDOWN AND REPAIR Right 05/18/2017    Performed by Tanja Port, MD at [facility]   ? REPAIR ACHILLES TENDON Left 12/22/2017    Performed by Vopat, Lowry Ram, MD [facility]   ? PLANTAR FASCIA RELEASE Left 12/22/2017    Performed by Vopat, Lowry Ram, MD at [facility]   ? FUSION SPINE ANTERIOR WITH DISCECTOMY/ DECOMPRESSION - CERVICAL 6-7 WITH ANTERIOR SPINAL INSTRUMENTATION Right 03/06/2018    Performed by Addison Lank, MD at [facility]   ? MICROSURGICAL TECHNIQUES - REQUIRING OPERATING MICROSCOPE USE N/A 03/06/2018    Performed by Addison Lank, MD at [facility]   ? COLONOSCOPY     ?  GASTRIC BYPASS      12/2017   ? HYSTEROSCOPY      endometriosis   ? SHOULDER SURGERY Bilateral 2003, 2004       Social History:   Social History     Socioeconomic History   ? Marital status: Married     Spouse name: Not on file   ? Number of children: Not on file   ? Years of education: Not on file   ? Highest education level: Not on file   Occupational History   ? Not on file   Tobacco Use   ? Smoking status: Never Smoker   ? Smokeless tobacco: Never Used   Substance and Sexual Activity   ? Alcohol use: No   ? Drug use: No   ? Sexual activity: Not on file   Other Topics Concern   ? Not on file   Social History Narrative   ? Not on file             Family History:  Family History   Problem Relation Age of Onset   ? Coronary Artery Disease Father    ? Diabetes Father    ? Transient Ischaemic Attack Father    ? Heart problem Father    ? Other Maternal Grandfather         memory decline at an old age   ? Cancer Maternal Aunt    ? Cancer Maternal Uncle        Allergies:  Allergies   Allergen Reactions   ? Adhesive Tape (Rosins) RASH   ? Bee [Venom-Honey Bee] EDEMA     carries epipen   ? Latex RASH   ? Succinylcholine MUSCLE PAIN   ? Benadryl [Diphenhydramine Hcl] SEE COMMENTS     restless leg syndrome   ? Chromium REDNESS   ? Codeine NAUSEA AND VOMITING   ? Hydrocodone ITCHING   ? Metoclopramide STOMACH UPSET   ? Morphine NAUSEA AND VOMITING   ? Oxycodone ITCHING     tolerates with antihistamine       Objective:         ? albuterol sulfate (PROAIR HFA) 90 mcg/actuation aerosol inhaler Inhale 1-2 puffs by mouth into the lungs every 6 hours as needed.   ? ascorbic acid (VITAMIN C) 500 mg tablet Take 1 tablet by mouth twice daily.   ? cholecalciferol (VITAMIN D-3) 1,000 units tablet Take 1,000 Units by mouth daily.   ? Cyanocobalamin-Cobamamide (B12) 5,000-100 mcg lozg Place  under tongue.   ? docusate (COLACE) 100 mg capsule Take one capsule by mouth twice daily as needed for Constipation.   ? duloxetine DR (CYMBALTA) 60 mg capsule Take 60 mg by mouth twice daily.   ? estradiol cypionate(+) (DEPO-ESTRADIOL) 5 mg/mL oil Inject 0.4 mg into the muscle. Every 19 days    ? famotidine (PEPCID) 20 mg tablet Take 20 mg by mouth twice daily.   ? fexofenadine(+) (ALLEGRA) 180 mg tablet Take 180 mg by mouth at bedtime daily.   ? fluoxetine (PROZAC) 20 mg capsule Take 20 mg by mouth daily.   ? MAGNESIUM PO Take 1 tablet by mouth daily. 100mg    ? methocarbamoL (ROBAXIN) 750 mg tablet Take one tablet by mouth three times daily as needed for Spasms.   ? Multivitamins with Fluoride (MULTI-VITAMIN PO) Take  by mouth.   ? MYRBETRIQ 50 mg tablet Take 50 mg by mouth daily.   ? nortriptyline (PAMELOR) 25 mg capsule Take 25 mg by mouth  at bedtime daily.   ? omeprazole DR (PRILOSEC) 20 mg capsule Take 20 mg by mouth twice daily.   ? other medication [employer] Bariastic Multivitamin 1 tablet by mouth twice daily   ? Potassium 99 mg tab Take  by mouth.   ? traMADol (ULTRAM) 50 mg tablet Take one tablet by mouth every 4 hours as needed.   ? ultrasonic cleaner,holding sol (POWER CLEANER MISC) Use 400 mg as directed.   ? zinc sulfate 220 mg (50 mg elemental zinc) capsule Take 220 mg by mouth daily.     Vitals:    03/11/20 1304   BP: 117/77   BP Source: Arm, Left Upper   Patient Position: Sitting   Pulse: 96   Temp: 36.8 ?C (98.2 ?F)   TempSrc: Oral   SpO2: 97%   Weight: 72.1 kg (158 lb 14.4 oz)   PainSc: Five     Body mass index is 29.06 kg/m?Marland Kitchen       Physical Exam    General: WG/WN/WD, calm, cooperative, follows commands  HEENT: NC/AT  Cardiac: RRR without murmur  Neck: supple, full ROM  ?  Speech: fluent, no dysarthria or aphasia  Mental status: Alert, oriented x 4, NAD  ?  CN: PERRL, EOMI without restriction or nystagmus, facial sensation symmetric (V1-V3) to LT bilaterally, No facial droop or ptosis, hearing equal to FR, palatal rise symmetric, shoulder shrug symmetric, tongue midline  ?  Strength:   UEs:      5/5 SA/EF/EE/WE/WF  LEs:      5/5 HF/KE/DF/PF  No involuntary movements, no tremor  ?  Sensation: LT symmetric in all limbs, no dermatomal deficit  ?  DTRs: 2/2 in BR/B/P 1/1A, downgoing plantar reflexes bilaterally  ?  Gait: deferred       Assessment and Plan:    1.  Neurocardiogenic syncope with recurrence  2.  Cervical spondylosis without myelopathy    Plan:  1.  Start Florinef 0.1 mg daily for syncope  2.  Discussed gradual position changes, salt intake and behavioral modifications to limit risk of syncope and injury  3.  Increase water volume intake  4.  Refer to general internal medicine.  Patient would like new primary care provider.  Medical evaluation for syncope appropriate.  Doubt primary neurologic pathology  5.  Consider midodrine if blood pressure drops or syncope persists.  Has reported significantly low blood pressure with spells.  6.  Monitor symptoms.  RTC 6 months or as needed sooner.

## 2020-03-11 NOTE — Patient Instructions
Dr. Southwell and his staff appreciate the opportunity to care for you today. A few items to assist you with what comes next:     *If lab work or imaging was ordered, I will be in contact with you only if the results are abnormal enough that Dr. Southwell requests for your to be notified. If needed, adjustments to the plan will be made.  You are very welcome to contact me to discuss results sooner if you'd prefer.     *All testing and lab work be done here at [facility] unless insurance requires an alternative.  If any part of your care such as a referral doesn't seem to be getting scheduled within a week, please contact our office so we can assist you.     *Dr. Southwell has asked that you be scheduled for a follow up appointment in a specific amount of time to go over all results with him.  If all testing has not been completed by your scheduled follow up appointment, we will need to reschedule this appointment.  At your follow up appointment he will explain results, continue neurological treatment or discharge you back to your referring physician.      Feel free to call or message me through my chart.  I'm here to assist you between appointments and can discuss concerns with Dr. Southwell as needed.      Changes in appointment and wait list requests can be made by calling our scheduling line at [phone].     Our goal is to provide you with the very best patient care. If we have not met this expectation, please call me to discuss as we appreciate opportunities for improvement.  We are very glad to have met you today and hope you were pleased with the care you received.     Information to contact nursing staff for Dr. Southwell  [phone]  [phone] fax  [phone] scheduling  Send message by mychart

## 2020-03-17 ENCOUNTER — Encounter: Admit: 2020-03-17 | Discharge: 2020-03-17 | Payer: BC Managed Care – HMO

## 2020-03-17 DIAGNOSIS — M47812 Spondylosis without myelopathy or radiculopathy, cervical region: Secondary | ICD-10-CM

## 2020-03-18 ENCOUNTER — Encounter: Admit: 2020-03-18 | Discharge: 2020-03-18 | Payer: BC Managed Care – HMO

## 2020-03-18 ENCOUNTER — Ambulatory Visit: Admit: 2020-03-18 | Discharge: 2020-03-18 | Payer: BC Managed Care – HMO

## 2020-03-18 DIAGNOSIS — N200 Calculus of kidney: Secondary | ICD-10-CM

## 2020-03-18 DIAGNOSIS — M797 Fibromyalgia: Secondary | ICD-10-CM

## 2020-03-18 DIAGNOSIS — J45909 Unspecified asthma, uncomplicated: Secondary | ICD-10-CM

## 2020-03-18 DIAGNOSIS — G43909 Migraine, unspecified, not intractable, without status migrainosus: Secondary | ICD-10-CM

## 2020-03-18 DIAGNOSIS — T753XXA Motion sickness, initial encounter: Secondary | ICD-10-CM

## 2020-03-18 DIAGNOSIS — R0902 Hypoxemia: Secondary | ICD-10-CM

## 2020-03-18 DIAGNOSIS — G4730 Sleep apnea, unspecified: Secondary | ICD-10-CM

## 2020-03-18 DIAGNOSIS — M47812 Spondylosis without myelopathy or radiculopathy, cervical region: Secondary | ICD-10-CM

## 2020-03-18 DIAGNOSIS — R4189 Other symptoms and signs involving cognitive functions and awareness: Secondary | ICD-10-CM

## 2020-03-18 MED ORDER — BUPIVACAINE (PF) 0.5 % (5 MG/ML) IJ SOLN
3 mL | Freq: Once | INTRAMUSCULAR | 0 refills | Status: CP
Start: 2020-03-18 — End: ?
  Administered 2020-03-18: 18:00:00 3 mL via INTRAMUSCULAR

## 2020-03-18 NOTE — Progress Notes
SPINE CENTER  INTERVENTIONAL PAIN PROCEDURE HISTORY AND PHYSICAL    No chief complaint on file.      HISTORY OF PRESENT ILLNESS:    Left neck pain due to spondylosis  80% relief for 3 hours with previous C5-7 MBB on the left  Able to turn head easily    Medical History:   Diagnosis Date   ? Asthma     allergies and exercise induced   ? Cognitive impairment since 08/2008    /memory; most likely 2/2 adjustment reaction with depressive sx's as  she had a prolonged period of illness after sx   ? Fibromyalgia    ? Hypoxia 2011    post procedural pain medicine   ? Kidney stones 1984   ? Migraines    ? Motion sickness    ? Sleep apnea     uses CPAP machine       Surgical History:   Procedure Laterality Date   ? APPENDECTOMY  1983   ? HX HYSTERECTOMY  1984   ? CHOLECYSTECTOMY  2002   ? CARPAL TUNNEL RELEASE Bilateral 2004   ? HAND SURGERY Left 2004    thumb   ? LYSIS OF ADHESIONS  2011    abdominal adhesiolysis surgery   ? KNEE SURGERY Left 2014    meniscus repair   ? KIDNEY STONE SURGERY  05/2017    7mm stone removed   ? RIGHT ACHILLES TAKEDOWN AND REPAIR Right 05/18/2017    Performed by Tanja Port, [facility]   ? REPAIR ACHILLES TENDON Left 12/22/2017    Performed by Vopat, Lowry Ram, MD at [facility]   ? PLANTAR FASCIA RELEASE Left 12/22/2017    Performed by Vopat, Lowry Ram, MD at [facility]   ? FUSION SPINE ANTERIOR WITH DISCECTOMY/ DECOMPRESSION - CERVICAL 6-7 WITH ANTERIOR SPINAL INSTRUMENTATION Right 03/06/2018    Performed by Addison Lank, MD at [facility]   ? MICROSURGICAL TECHNIQUES - REQUIRING OPERATING MICROSCOPE USE N/A 03/06/2018    Performed by Addison Lank, MD at [facility]   ? COLONOSCOPY     ? GASTRIC BYPASS      12/2017   ? HYSTEROSCOPY      endometriosis   ? SHOULDER SURGERY Bilateral 2003, 2004       family history includes Cancer in her maternal aunt and maternal uncle; Coronary Artery Disease in her father; Diabetes in her father; Heart problem in her father; Other in her maternal grandfather; Transient Ischaemic Attack in her father.    Social History     Socioeconomic History   ? Marital status: Married     Spouse name: Not on file   ? Number of children: Not on file   ? Years of education: Not on file   ? Highest education level: Not on file   Occupational History   ? Not on file   Tobacco Use   ? Smoking status: Never Smoker   ? Smokeless tobacco: Never Used   Substance and Sexual Activity   ? Alcohol use: No   ? Drug use: No   ? Sexual activity: Not on file   Other Topics Concern   ? Not on file   Social History Narrative   ? Not on file       Allergies   Allergen Reactions   ? Adhesive Tape (Rosins) RASH   ? Bee [Venom-Honey Bee] EDEMA     carries epipen   ? Latex RASH   ?  Succinylcholine MUSCLE PAIN   ? Benadryl [Diphenhydramine Hcl] SEE COMMENTS     restless leg syndrome   ? Chromium REDNESS   ? Codeine NAUSEA AND VOMITING   ? Hydrocodone ITCHING   ? Metoclopramide STOMACH UPSET   ? Morphine NAUSEA AND VOMITING   ? Oxycodone ITCHING     tolerates with antihistamine       Vitals:    03/18/20 1134   BP: 130/85   BP Source: Arm, Right Upper   SpO2: 99%       REVIEW OF SYSTEMS: 10 point ROS obtained and negative except neck pain      PHYSICAL EXAM:  General: Alert, cooperative, no acute distress.  HEENT: Normocephalic, atraumatic.  Neck: Supple.  Lungs: Unlabored respirations, bilateral and equal chest excursion.  Heart: Regular rate.  Skin: Warm and dry to touch.  Abdomen: Nondistended.  MSK: No deformity.  Neurological: Alert and oriented x3.          IMPRESSION:    1. Cervical spondylosis without myelopathy         PLAN:   L C5-7 MBB #2  RFA if good relief

## 2020-03-18 NOTE — Patient Instructions
Discharge Instructions for Medial Branch Block    Important information following your procedure today: You may drive today    This injection is for diagnostic purposes, it is a test. Only short term results are expected.    1. Though the procedure is generally safe and complications are rare, we do ask that you be aware of any of the following:   ? Any swelling, persistent redness, new bleeding, or drainage from the site of the injection.  ? You should not experience a severe headache.  ? You should not run a fever over 101? F.  ? New onset of sharp, severe back & or neck pain.  ? New onset of upper or lower extremity numbness or weakness.  ? New difficulty controlling bowel or bladder function after the injection.  ? New shortness of breath.    If any of these occur, please call to report this occurrence to Dr. Evelena Leyden at [phone]. If you are calling after 4:00 p.m. or on weekends or holidays please call [phone] and ask to have the resident physician on call for the physician paged or go to your local emergency room.  2. You may experience soreness at the injection site. Ice can be applied at 20 minute intervals. Avoid application of direct heat, hot showers or hot tubs today.  3. Patients taking a daily blood thinner can resume their regular dose this evening.  4. It is important that you take all medications ordered by your pain physician. Taking medication as ordered is an important part of your pain care plan. If you cannot continue the medication plan, please notify the physician.   5. Remain active today. Do the activities that would normally cause you pain.  6. It is important for you to keep track of the results of this test on paper.  ? Did you get relief?  ? Percentage of relief?  ? How long did it last?  Call back to report the results to a nurse on [facility] at Dr. Evelena Leyden at [phone]. Use notes that you kept when giving your report. You may have to leave a message and a nurse will contact you to help you determine if you are a candidate for the Radiofrequency Ablation Procedure.    The following medications were used: Bupivicaine      PAIN DIARY  Please report pain on 0-10 scale for each hour listed following discharge.  (0 = No pain; 5 = Moderate pain; 10 = Worst pain of your life)  TIME DAY OF PROCEDURE LOCATION OF PAIN   Pain Level  Prior to Procedure     9:00 AM     10:00     11:00     12:00 (NOON)     1:00 PM     2:00     3:00     4:00     5:00     6:00     7:00     8:00     9:00     10:00     11:00 PM     12:00 AM (MIDNIGHT)              If you are unable to keep your upcoming appointment, please notify the [facility] scheduler at [phone] at least 24 hours in advance.    What is Procedural Sedation?  Procedural sedation is medicine given to help calm and relax you in order to complete a test/procedure that may take a period of time, or  otherwise be painful. These medications can help reduce anxiety and pain related to the procedure.  ?  Do I need Procedural Sedation?  You will be screened to make sure sedation is appropriate based on your medical history and the procedure needing to be completed.? Your physician will choose sedation medications based on your age, overall health status, and duration and/or invasiveness of the procedure.  ?  Sedation medications may be given various ways  ? By mouth (drink liquid or take a pill)???????  ? Intranasal (a medication sprayed/injected to the nostril)  ? Intramuscular (an injection into the muscle)  ? IV (medication injected through an IV in your vein)  ?  How is Procedural Sedation different from General Anesthesia?  Sedation is a continuum rather than clearly defined levels- meaning you can progress from one level of sedation to both a lighter or deeper level during the procedure.  ?    ?  The goal of Procedural Sedation is that your pain is controlled, but you are able to breathe and protect your own airway. You may have limited memory of the procedure. However, you should be arousable throughout the procedure and able to talk with your nurse.?  ?  During General Anesthesia you are completely unconscious and may require assistance with your breathing and/or blood pressure during the procedure.? Anesthesia providers will be involved in your care for this depth of sedation.  ?  Important information for the day of your Procedure  ? Prior to your procedure:  ? You should have no solid food for at least 8 hours.  ? You can have clear liquids up until 2 hours before your procedure arrival time.  ? (example: tea, apple juice, water, coffee- no milk or creamers)  ? After your sedation, you will be monitored to ensure it is safe for you to go home.  ? You must have a responsible adult with you upon your discharge from the procedure to escort you home.? A taxi, Benedetto Goad, or bus can be your way home if you choose, but the driver cannot be your responsible party.? You may not drive yourself.  ?

## 2020-03-18 NOTE — Progress Notes

## 2020-03-19 ENCOUNTER — Ambulatory Visit: Admit: 2020-03-19 | Discharge: 2020-03-19 | Payer: BC Managed Care – HMO

## 2020-03-19 ENCOUNTER — Encounter: Admit: 2020-03-19 | Discharge: 2020-03-19 | Payer: BC Managed Care – HMO

## 2020-03-19 DIAGNOSIS — J45909 Unspecified asthma, uncomplicated: Secondary | ICD-10-CM

## 2020-03-19 DIAGNOSIS — T753XXA Motion sickness, initial encounter: Secondary | ICD-10-CM

## 2020-03-19 DIAGNOSIS — G43909 Migraine, unspecified, not intractable, without status migrainosus: Secondary | ICD-10-CM

## 2020-03-19 DIAGNOSIS — M797 Fibromyalgia: Secondary | ICD-10-CM

## 2020-03-19 DIAGNOSIS — R413 Other amnesia: Secondary | ICD-10-CM

## 2020-03-19 DIAGNOSIS — G4730 Sleep apnea, unspecified: Secondary | ICD-10-CM

## 2020-03-19 DIAGNOSIS — R55 Syncope and collapse: Secondary | ICD-10-CM

## 2020-03-19 DIAGNOSIS — R4189 Other symptoms and signs involving cognitive functions and awareness: Secondary | ICD-10-CM

## 2020-03-19 DIAGNOSIS — Z0000 Encounter for general adult medical examination without abnormal findings: Secondary | ICD-10-CM

## 2020-03-19 DIAGNOSIS — N200 Calculus of kidney: Secondary | ICD-10-CM

## 2020-03-19 DIAGNOSIS — R0902 Hypoxemia: Secondary | ICD-10-CM

## 2020-03-19 LAB — SYPHILIS AB SCREEN: Lab: NEGATIVE % (ref 36–45)

## 2020-03-19 LAB — CBC
Lab: 14 g/dL (ref 12.0–15.0)
Lab: 32 g/dL (ref 32.0–36.0)
Lab: 351 K/UL (ref 150–400)
Lab: 4.6 M/UL (ref 4.0–5.0)
Lab: 5.9 K/UL (ref 4.5–11.0)
Lab: 91 FL (ref 80–100)

## 2020-03-19 LAB — VITAMIN B12: Lab: 912 pg/mL — ABNORMAL LOW (ref 180–914)

## 2020-03-19 LAB — HIV 1& 2 AG-AB SCRN W REFLEX HIV 1 PCR QUANT

## 2020-03-19 LAB — HEPATITIS C ANTIBODY W REFLEX HCV PCR QUANT

## 2020-03-19 LAB — TSH WITH FREE T4 REFLEX: Lab: 1.5 uU/mL (ref 0.35–5.00)

## 2020-03-19 NOTE — Procedures
Attending Surgeon: Philomena Course, MD    Anesthesia: Local    Pre-Procedure Diagnosis:   1. Cervical spondylosis without myelopathy        Post-Procedure Diagnosis:   1. Cervical spondylosis without myelopathy        [place] [facility] SPINE INJECT MBB CERV/THOR  Procedure: medial branch block    Laterality: left    Location: - C5, C6 and C7      Consent:   Consent obtained: written  Consent given by: patient    Discussed with patient the purpose of the treatment/procedure, other ways of treating my condition, including no treatment/ procedure and the risks and benefits of the alternatives. Patient has decided to proceed with treatment/procedure.        Universal Protocol:  Relevant documents: relevant documents present and verified  Test results: test results available and properly labeled  Imaging studies: imaging studies available  Required items: required blood products, implants, devices, and special equipment available  Site marked: the operative site was marked  Patient identity confirmed: Patient identify confirmed verbally with patient.        Time out: Immediately prior to procedure a time out was called to verify the correct patient, procedure, equipment, support staff and site/side marked as required      Procedures Details:   Indications: diagnostic evaluation   Prep: chlorhexidine  Patient position: prone  Estimated Blood Loss: minimal  Specimens: none  Number of Joints: 2  Guidance: fluoroscopy  Needle and Epidural Catheter: quincke  Needle size: 25 G  Injection procedure: Negative aspiration for blood  Amount Injected:   C5: 1mL  C6: 1mL  C7: 1mL  Patient tolerance: Patient tolerated the procedure well with no immediate complications. Pressure was applied, and hemostasis was accomplished.  Comments: 0.41ml of bupivacaine 0.5% injected at each of 3 locations to anesthetize the left C5-6 and C6-7 facet joints        Estimated blood loss: none or minimal  Specimens: none  Patient tolerated the procedure well with no immediate complications. Pressure was applied, and hemostasis was accomplished.

## 2020-03-22 ENCOUNTER — Encounter: Admit: 2020-03-22 | Discharge: 2020-03-22 | Payer: BC Managed Care – HMO

## 2020-03-23 ENCOUNTER — Encounter: Admit: 2020-03-23 | Discharge: 2020-03-23 | Payer: BC Managed Care – HMO

## 2020-03-23 ENCOUNTER — Emergency Department: Admit: 2020-03-23 | Discharge: 2020-03-23 | Discharge status: Against Medical Advice | Payer: BC Managed Care – HMO

## 2020-03-23 DIAGNOSIS — R0902 Hypoxemia: Secondary | ICD-10-CM

## 2020-03-23 DIAGNOSIS — T753XXA Motion sickness, initial encounter: Secondary | ICD-10-CM

## 2020-03-23 DIAGNOSIS — G4730 Sleep apnea, unspecified: Secondary | ICD-10-CM

## 2020-03-23 DIAGNOSIS — R4189 Other symptoms and signs involving cognitive functions and awareness: Secondary | ICD-10-CM

## 2020-03-23 DIAGNOSIS — M797 Fibromyalgia: Secondary | ICD-10-CM

## 2020-03-23 DIAGNOSIS — G43909 Migraine, unspecified, not intractable, without status migrainosus: Secondary | ICD-10-CM

## 2020-03-23 DIAGNOSIS — J45909 Unspecified asthma, uncomplicated: Secondary | ICD-10-CM

## 2020-03-23 DIAGNOSIS — N200 Calculus of kidney: Secondary | ICD-10-CM

## 2020-03-26 ENCOUNTER — Encounter: Admit: 2020-03-26 | Discharge: 2020-03-26 | Payer: BC Managed Care – HMO

## 2020-03-26 NOTE — Telephone Encounter
ED Discharge Follow Up  Reached patient: Yes  Admission Information  Hospital Name : [facility]  ED Admission Date: 03/23/20 ED Discharge Date: 03/23/20 Admission Diagnosis: Dizziness  Discharge Diagnosis : Left without being seen  Hospital Services: Unplanned  Today's call is 3 (calendar) days post discharge    ? albuterol sulfate (PROAIR HFA) 90 mcg/actuation aerosol inhaler Inhale 1-2 puffs by mouth into the lungs every 6 hours as needed.   ? ascorbic acid (VITAMIN C) 500 mg tablet Take 1 tablet by mouth twice daily.   ? cholecalciferol (VITAMIN D-3) 1,000 units tablet Take 1,000 Units by mouth daily.   ? Cyanocobalamin-Cobamamide (B12) 5,000-100 mcg lozg Place  under tongue.   ? docusate (COLACE) 100 mg capsule Take one capsule by mouth twice daily as needed for Constipation.   ? duloxetine DR (CYMBALTA) 60 mg capsule Take 60 mg by mouth twice daily.   ? estradiol cypionate(+) (DEPO-ESTRADIOL) 5 mg/mL oil Inject 0.4 mg into the muscle. Every 19 days    ? famotidine (PEPCID) 20 mg tablet Take 20 mg by mouth twice daily.   ? fexofenadine(+) (ALLEGRA) 180 mg tablet Take 180 mg by mouth at bedtime daily.   ? fludrocortisone (FLORINEF) 0.1 mg tablet Take one tablet by mouth daily.   ? MAGNESIUM PO Take 1 tablet by mouth daily. 100mg    ? methocarbamoL (ROBAXIN) 750 mg tablet Take one tablet by mouth three times daily as needed for Spasms.   ? MYRBETRIQ 50 mg tablet Take 50 mg by mouth daily.   ? nortriptyline (PAMELOR) 25 mg capsule Take 25 mg by mouth at bedtime daily.   ? omeprazole DR (PRILOSEC) 20 mg capsule Take 20 mg by mouth twice daily.   ? other medication [facility] [employer] Multivitamin 1 tablet by mouth twice daily   ? Potassium 99 mg tab Take  by mouth.   ? traMADol (ULTRAM) 50 mg tablet Take one tablet by mouth every 4 hours as needed.   ? ultrasonic cleaner,holding sol (POWER CLEANER MISC) Use 400 mg as directed.   ? zinc sulfate 220 mg (50 mg elemental zinc) capsule Take 220 mg by mouth daily.         Scheduling Follow-up Appointment  Upcoming appointment date and time and with whom scheduled:   Future Appointments   Date Time Provider Department Center   04/08/2020  3:00 PM Philomena Course, MD [facility] SPINE   06/17/2020 12:00 PM Laveda Abbe R, DO [facility]   09/09/2020  1:30 PM Apolinar Junes., DO [facility] SPINE     When was patient?s last PCP visit: 03/19/2020  PCP primary location: [facility]  PCP appointment scheduled? No, transferred to office for appt  Specialist appointment scheduled? No  Both PCP and Specialist appointment scheduled: No  Is assistance with transportation needed? No  MyChart message sent? Active in MyChart. No message sent.     ED Communication   Did Pt call Clinic prior to going to ED? No  Reason patient went to ED: Fear of having a critical medical condition    Dewayne Hatch states she left [place] ED after a long wait.  She had EKG completed, and checked her HR while waiting, with some improvement.  She states she stopped taking Florinef d/t causing elevated HR.      She reports home BP readings of 152/82 HR 106, and 140/96 HR 89.  She will contact Dr. Dorris Fetch office to discuss Florinef, and was transferred  to PCP office for appt. She is aware to return to ED with any worsening symptoms.       Ashley Gaines

## 2020-04-07 ENCOUNTER — Encounter: Admit: 2020-04-07 | Discharge: 2020-04-07 | Payer: BC Managed Care – HMO

## 2020-04-08 ENCOUNTER — Ambulatory Visit: Admit: 2020-04-08 | Discharge: 2020-04-08 | Payer: BC Managed Care – HMO

## 2020-04-08 ENCOUNTER — Encounter: Admit: 2020-04-08 | Discharge: 2020-04-08 | Payer: BC Managed Care – HMO

## 2020-04-08 DIAGNOSIS — M47812 Spondylosis without myelopathy or radiculopathy, cervical region: Secondary | ICD-10-CM

## 2020-04-08 MED ORDER — FENTANYL CITRATE (PF) 50 MCG/ML IJ SOLN
50-100 ug | INTRAVENOUS | 0 refills | Status: DC | PRN
Start: 2020-04-08 — End: 2020-04-08
  Administered 2020-04-08: 20:00:00 100 ug via INTRAVENOUS

## 2020-04-08 MED ORDER — MIDAZOLAM 1 MG/ML IJ SOLN
1-2 mg | INTRAVENOUS | 0 refills | Status: DC | PRN
Start: 2020-04-08 — End: 2020-04-08
  Administered 2020-04-08: 20:00:00 1 mg via INTRAVENOUS

## 2020-04-08 MED ORDER — BUPIVACAINE (PF) 0.5 % (5 MG/ML) IJ SOLN
6 mL | Freq: Once | INTRAMUSCULAR | 0 refills | Status: CP
Start: 2020-04-08 — End: ?
  Administered 2020-04-08: 20:00:00 6 mL via INTRAMUSCULAR

## 2020-04-08 NOTE — Progress Notes
Sedation physician present in room.  Recent vitals and patient condition reviewed between sedating physician and nurse.  Reassessment completed.  Determination made to proceed with planned sedation.

## 2020-04-08 NOTE — Progress Notes
SPINE CENTER  INTERVENTIONAL PAIN PROCEDURE HISTORY AND PHYSICAL    Chief complaint: I'm here for my pain injection     HISTORY OF PRESENT ILLNESS:  Patient returns today for interventional treatment of Neck pain. Patient denies any recent fevers, chills, infection, antibiotics, coagulopathy or contra-indicated anticoagulants. Risks of the procedure were discussed including but not limited to bleeding, infection, damage to surrounding structures and reaction to medications. Patient reports understanding and has elected to proceed with the procedure.    Also bilateral LBP- paraspinal, nonradiating. Tight, sore, worse with movement. Present for >3 months. Minimal relief with stretching, exercises, medications over 3 months.      Medical History:   Diagnosis Date   ? Asthma     allergies and exercise induced   ? Cognitive impairment since 08/2008    /memory; most likely 2/2 adjustment reaction with depressive sx's as  she had a prolonged period of illness after sx   ? Fibromyalgia    ? Hypoxia 2011    post procedural pain medicine   ? Kidney stones 1984   ? Migraines    ? Motion sickness    ? Sleep apnea     uses CPAP machine       Surgical History:   Procedure Laterality Date   ? APPENDECTOMY  1983   ? HX HYSTERECTOMY  1984   ? CHOLECYSTECTOMY  2002   ? CARPAL TUNNEL RELEASE Bilateral 2004   ? HAND SURGERY Left 2004    thumb   ? LYSIS OF ADHESIONS  2011    abdominal adhesiolysis surgery   ? KNEE SURGERY Left 2014    meniscus repair   ? KIDNEY STONE SURGERY  05/2017    7mm stone removed   ? RIGHT ACHILLES TAKEDOWN AND REPAIR Right 05/18/2017    Performed by Tanja Port, MD at [facility]   ? REPAIR ACHILLES TENDON Left 12/22/2017    Performed by Vopat, Lowry Ram, MD at [facility]   ? PLANTAR FASCIA RELEASE Left 12/22/2017    Performed by Vopat, Lowry Ram, MD at [facility]   ? FUSION SPINE ANTERIOR WITH DISCECTOMY/ DECOMPRESSION - CERVICAL 6-7 WITH ANTERIOR SPINAL INSTRUMENTATION Right 03/06/2018    Performed by Addison Lank, MD at [facility]   ? MICROSURGICAL TECHNIQUES - REQUIRING OPERATING MICROSCOPE USE N/A 03/06/2018    Performed by Addison Lank, MD at [facility]   ? COLONOSCOPY     ? GASTRIC BYPASS      12/2017   ? HYSTEROSCOPY      endometriosis   ? SHOULDER SURGERY Bilateral 2003, 2004       family history includes Cancer in her maternal aunt and maternal uncle; Coronary Artery Disease in her father; Diabetes in her father; Heart problem in her father; Other in her maternal grandfather; Transient Ischaemic Attack in her father.    Social History     Socioeconomic History   ? Marital status: Married     Spouse name: Not on file   ? Number of children: Not on file   ? Years of education: Not on file   ? Highest education level: Not on file   Occupational History   ? Not on file   Tobacco Use   ? Smoking status: Never Smoker   ? Smokeless tobacco: Never Used   Substance and Sexual Activity   ? Alcohol use: No   ? Drug use: No   ? Sexual activity: Not on file   Other  Topics Concern   ? Not on file   Social History Narrative   ? Not on file       Allergies   Allergen Reactions   ? Adhesive Tape (Rosins) RASH   ? Bee [Venom-Honey Bee] EDEMA     carries epipen   ? Latex RASH   ? Succinylcholine MUSCLE PAIN   ? Benadryl [Diphenhydramine Hcl] SEE COMMENTS     restless leg syndrome   ? Chromium REDNESS   ? Codeine NAUSEA AND VOMITING   ? Hydrocodone ITCHING   ? Metoclopramide STOMACH UPSET   ? Morphine NAUSEA AND VOMITING   ? Oxycodone ITCHING     tolerates with antihistamine       There were no vitals filed for this visit.      REVIEW OF SYSTEMS: 10 point ROS obtained and negative except as above and below      PHYSICAL EXAM:  General: Alert, cooperative, no acute distress.  HEENT: Normocephalic, atraumatic.  Neck: Supple.  Lungs: Unlabored respirations, bilateral and equal chest excursion.  Heart: Regular rate.  Skin: Warm and dry to touch.  Abdomen: Nondistended.  MSK: No deformity.  Neurological: Alert and oriented x3.         IMPRESSION: 1. Cervical spondylosis without myelopathy         PLAN: Other left C5-7 RFA   Lumbar TPI    General Pre Procedural Sedation ASA Classification      I have discussed risks and alternatives of this type of sedation and procedure with: patient    NPO Status:Acceptable    Pregnancy Status: No    Prior Anesthetic Types: None    Patient's had previous experience with anesthesia and/or sedation complications: No    Family history of sedation complications: No    Airway: Airway assessment performed Mallampati  II (soft palate, uvula, fauces visible)    Head and Neck: No abnormalities noted    Mouth: No abnormalities noted    Medications for Procedural Sedation: Midazolam and Fentanyl    Anesthesia Classification:  ASA II (A normal patient with mild systemic disease)    Patient remains a candidate for procedure: Yes    The intention for the procedure today is Anxiolysis/Analgesia.

## 2020-04-08 NOTE — Procedures
Attending Surgeon: Philomena Course, MD    Anesthesia: Local    Pre-Procedure Diagnosis:   1. Cervical spondylosis without myelopathy        Post-Procedure Diagnosis:   1. Cervical spondylosis without myelopathy        DESTRUCTION OF NERVE W/FLUORO CERVICAL/THORACIC    Laterality: bilateral    Location: Cervical/Thoracic -  C5, C7 and C6      Consent:   Consent obtained: written  Consent given by: patient    Discussed with patient the purpose of the treatment/procedure, other ways of treating my condition, including no treatment/ procedure and the risks and benefits of the alternatives. Patient has decided to proceed with treatment/procedure.        Universal Protocol:  Relevant documents: relevant documents present and verified  Test results: test results available and properly labeled  Imaging studies: imaging studies available  Required items: required blood products, implants, devices, and special equipment available  Site marked: the operative site was marked  Patient identity confirmed: Patient identify confirmed verbally with patient.        Time out: Immediately prior to procedure a time out was called to verify the correct patient, procedure, equipment, support staff and site/side marked as required      Procedures Details:   Indications: pain     Prep: chlorhexidine  Local anesthetic:  1% lidocaine - 1mL  Sedation: anxiolysisPatient position: prone  Estimated Blood Loss: minimal  Specimens: none  Number of Levels Ablated: 2  Approach: left paramedian  Guidance: fluoroscopy  Needle size: 18 G  Active Needle Tip Length: 10mm  Neurolytic Technique: Radiofrequency Ablation    Motor Testing: Motor testing complete per protocol   Injection procedure: Incremental injection  Patient tolerance: Patient tolerated the procedure well with no immediate complications. Pressure was applied, and hemostasis was accomplished.  Comments:                   ANESTHESIA PROCEDURE REPORT    Radiofrequency Ablation (RFA) of Cervical Medial Branch Nerves    Date of Service: 04/08/20     Procedure Title(s):    1. Radiofrequency ablation of the innervation for the left C5-C6 and C6-C7 facet joints.   2. Intraoperative fluoroscopy    Attending Surgeon: Philomena Course, MD    Anesthesia: Local          Conscious Sedation Yes    Indications: Ashley Gaines is a 55 y.o. female with a diagnosis of cervical spondylosis. The patient's history and physical exam were reviewed. The patient has failed conservative measures including physical therapy and medication management. On exam the patient exhibits significant tenderness in the above stated levels which is exacerbated by extension and lateral flexion to the painful sides. The patient has had Two medial branch blocks with greater than 80% reduction in pain for the duration of the local anesthetic. The risks, benefits and alternatives to the procedure were discussed, and all questions were answered to the patient's satisfaction. The patient agreed to proceed, and written informed consent was obtained.     Procedure in Detail: IV was started? Yes    The patient was brought into the procedure room and placed in the prone position on the fluoroscopy table. Standard monitors were placed, and vital signs were observed throughout the procedure. The occipital region, cervical spine, and upper thoracic spine were prepped with chlorhexadine and draped in a sterile manner. AP fluoroscopy was used to identify and mark the vertebral waists at  the C5-C7 levels on the left. The skin and subcutaneous tissues in these identified areas were anesthetized with 1% lidocaine. An 18-gauge, 3.5 inch, 10 mm active tip radiofrequency probe was advanced toward each of these points under fluoroscopic guidance. Once bone was contacted, the needles were walked off laterally and positioned in the mid-facet column on lateral fluoroscopy. Negative aspiration was confirmed. Motor stimulation at 2 Hz and 2 volts was negative. 1mL of a solution containing a 1:1 mixture of lidocaine 1% and bupivacaine 0.5% was injected prior to lesioning, which was performed for 60 seconds at 80 degrees centigrade.       The probes were removed with a 1% lidocaine flush. The patient's neck was cleaned, and bandages were placed at the needle insertion sites.    Disposition: The patient tolerated the procedure well, and there were no apparent complications. Vital signs remained stable throughout the procedure. The patient was taken to the recovery area where discharge instructions for the procedure were given.    Estimated Blood Loss: Minimal    Specimens: None    Complications: None            Estimated blood loss: none or minimal  Specimens: none  Patient tolerated the procedure well with no immediate complications. Pressure was applied, and hemostasis was accomplished.

## 2020-06-10 ENCOUNTER — Ambulatory Visit: Admit: 2020-06-10 | Discharge: 2020-06-10 | Payer: BC Managed Care – HMO

## 2020-06-10 ENCOUNTER — Encounter: Admit: 2020-06-10 | Discharge: 2020-06-10 | Payer: BC Managed Care – HMO

## 2020-06-10 DIAGNOSIS — M5412 Radiculopathy, cervical region: Secondary | ICD-10-CM

## 2020-06-10 DIAGNOSIS — M545 Chronic bilateral low back pain without sciatica: Secondary | ICD-10-CM

## 2020-06-10 DIAGNOSIS — G43909 Migraine, unspecified, not intractable, without status migrainosus: Secondary | ICD-10-CM

## 2020-06-10 DIAGNOSIS — N200 Calculus of kidney: Secondary | ICD-10-CM

## 2020-06-10 DIAGNOSIS — J45909 Unspecified asthma, uncomplicated: Secondary | ICD-10-CM

## 2020-06-10 DIAGNOSIS — T753XXA Motion sickness, initial encounter: Secondary | ICD-10-CM

## 2020-06-10 DIAGNOSIS — M797 Fibromyalgia: Secondary | ICD-10-CM

## 2020-06-10 DIAGNOSIS — G4730 Sleep apnea, unspecified: Secondary | ICD-10-CM

## 2020-06-10 DIAGNOSIS — R4189 Other symptoms and signs involving cognitive functions and awareness: Secondary | ICD-10-CM

## 2020-06-10 DIAGNOSIS — R0902 Hypoxemia: Secondary | ICD-10-CM

## 2020-06-10 MED ORDER — PREGABALIN 25 MG PO CAP
ORAL_CAPSULE | Freq: Two times a day (BID) | 2 refills | Status: AC
Start: 2020-06-10 — End: ?

## 2020-06-10 NOTE — Progress Notes
[facility]INE [facility] HISTORY AND PHYSICAL         HISTORY OF PRESENT ILLNESS:        06/10/20:  Ashley Gaines presents with worsening of pain in left side of her neck.  This is in the posterior neck and radiates to the left upper shoulder as well as the left scapula.  This is similar to pain she has experienced in the past.  She underwent cervical radiofrequency ablation for this pain in January.  Unfortunately, she believes her pain is worsened since then.  She does notice pain that radiates down her left upper extremity at times.  She reports her arm strength has been okay.  Her pain is definitely worse in the cold weather.  It is temporarily improved by heating pads, which she uses frequently.  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  Any amount of neck  movement increases her pain.  She is using a muscle relaxant at night but it makes her too sleepy to use during the day.  She also continues to have pain in the lower back.  She had 1 day of good improvement with the trigger point injection, then pain rapidly returned.  She is completed physical therapy for her neck as well as several her medications without much pain relief.  She has previously undergone cervical epidural steroid injections without benefit.      01/08/20:  Ashley Gaines returns today for pain in the left side of the neck radiating to the left shoulder.  She recently had a cervical epidural steroid injection.  Unfortunately, she has not noticed any relief with this.  Her pain has not changed compared to her description in the previous HPI detailed below.    She also mentions another area of pain she would like to have evaluated.  This is in the mid back.  It is just above and below her bra strap, immediately below the shoulder blades.  This pain has been present for several years and gradually worsening.  There is no inciting event.  The pain is mostly in the middle of the back but occasionally radiates to the bilateral posterior chest.  It does not radiate to the anterior chest.  She denies any numbness or tingling in the region.  It is worse when she wears a bra and better when she removes her bra.  She had MRI of the thoracic spine in 2019 to evaluate this.  This was normal.  She has tried the medications listed below which have not helped with this type of pain.  She has never had any interventions to the thoracic spine.  She wonders if some of her thoracic pain is related to strain from wearing a bra with large breasts.        11/20/19:  Ashley Gaines is referred today by neurosurgeon Dr. Earlene Plater for neck and left shoulder pain.  This pain started after a C6-7 ACDF in 2019.  She did not have any similar pain before hand.  It is primarily in the base of the left neck and in the left posterior upper trapezius area.  It is a constant sharp and burning sensation.  It is worse with shoulder movements.  Sometimes it radiates very slightly distal to the shoulder but not any further into the arm.  She denies any numbness, tingling, or weakness in the bilateral upper extremities.  Her pain is worse when wearing a bra.  She has a  history of left shoulder surgery several years ago but this pain is different.  Massage and physical therapy have not provided any long-term relief.  She has tried baclofen, duloxetine, and gabapentin without any relief.  Tramadol helps mildly.  Trigger point injections did not help on 2 different occasions.  She is also being evaluated for syncope as a separate problem.  She lives approximately 90 minutes from [facility].  She recently had plain films and MRI of the cervical spine.        Pain diagram:               Medical History:   Diagnosis Date   ? Asthma     allergies and exercise induced   ? Cognitive impairment since 08/2008    /memory; most likely 2/2 adjustment reaction with depressive sx's as  she had a prolonged period of illness after sx   ? Fibromyalgia    ? Hypoxia 2011    post procedural pain medicine   ? Kidney stones 1984   ? Migraines    ? Motion sickness    ? Sleep apnea     uses CPAP machine         Surgical History:   Procedure Laterality Date   ? APPENDECTOMY  1983   ? HX HYSTERECTOMY  1984   ? CHOLECYSTECTOMY  2002   ? CARPAL TUNNEL RELEASE Bilateral 2004   ? HAND SURGERY Left 2004    thumb   ? LYSIS OF ADHESIONS  2011    abdominal adhesiolysis surgery   ? KNEE SURGERY Left 2014    meniscus repair   ? KIDNEY STONE SURGERY  05/2017    7mm stone removed   ? RIGHT ACHILLES TAKEDOWN AND REPAIR Right 05/18/2017    Performed by Tanja Port, MD at [facility]   ? REPAIR ACHILLES TENDON Left 12/22/2017    Performed by Vopat, Lowry Ram, MD at [facility]   ? PLANTAR FASCIA RELEASE Left 12/22/2017    Performed by Vopat, Lowry Ram, MD at [facility]   ? FUSION SPINE ANTERIOR WITH DISCECTOMY/ DECOMPRESSION - CERVICAL 6-7 WITH ANTERIOR SPINAL INSTRUMENTATION Right 03/06/2018    Performed by Addison Lank, MD at [facility]   ? MICROSURGICAL TECHNIQUES - REQUIRING OPERATING MICROSCOPE USE N/A 03/06/2018    Performed by Addison Lank, MD at [facility]   ? COLONOSCOPY     ? GASTRIC BYPASS      12/2017   ? HYSTEROSCOPY      endometriosis   ? SHOULDER SURGERY Bilateral 2003, 2004         Allergies   Allergen Reactions   ? Adhesive Tape (Rosins) RASH   ? Bee [Venom-Honey Bee] EDEMA     carries epipen   ? Latex RASH   ? Succinylcholine MUSCLE PAIN   ? Benadryl [Diphenhydramine Hcl] SEE COMMENTS     restless leg syndrome   ? Chromium REDNESS   ? Codeine NAUSEA AND VOMITING   ? Hydrocodone ITCHING   ? Metoclopramide STOMACH UPSET   ? Morphine NAUSEA AND VOMITING   ? Oxycodone ITCHING     tolerates with antihistamine         Current Outpatient Medications on File Prior to Visit   Medication Sig Dispense Refill   ? albuterol sulfate (PROAIR HFA) 90 mcg/actuation aerosol inhaler Inhale 1-2 puffs by mouth into the lungs every 6 hours as needed.     ? ascorbic acid (VITAMIN C) 500  mg tablet Take 1 tablet by mouth twice daily.     ? baclofen (LIORESAL) 10 mg tablet      ? cholecalciferol (VITAMIN D-3) 1,000 units tablet Take 1,000 Units by mouth daily.     ? Cyanocobalamin-Cobamamide (B12) 5,000-100 mcg lozg Place  under tongue.     ? docusate (COLACE) 100 mg capsule Take one capsule by mouth twice daily as needed for Constipation. 60 capsule 0   ? duloxetine DR (CYMBALTA) 60 mg capsule Take 60 mg by mouth twice daily.     ? estradiol cypionate(+) (DEPO-ESTRADIOL) 5 mg/mL oil Inject 0.4 mg into the muscle. Every 19 days      ? famotidine (PEPCID) 20 mg tablet Take 20 mg by mouth twice daily.     ? fexofenadine(+) (ALLEGRA) 180 mg tablet Take 180 mg by mouth at bedtime daily.     ? fludrocortisone (FLORINEF) 0.1 mg tablet Take one tablet by mouth daily. 90 tablet 2   ? MAGNESIUM PO Take 1 tablet by mouth daily. 100mg      ? methocarbamoL (ROBAXIN) 750 mg tablet Take one tablet by mouth three times daily as needed for Spasms. 90 tablet 2   ? MYRBETRIQ 50 mg tablet Take 50 mg by mouth daily.     ? nortriptyline (PAMELOR) 25 mg capsule Take 25 mg by mouth at bedtime daily.  1   ? omeprazole DR (PRILOSEC) 20 mg capsule Take 20 mg by mouth twice daily.     ? other medication Journey Bariastic Multivitamin 1 tablet by mouth twice daily     ? Potassium 99 mg tab Take  by mouth.     ? traMADol (ULTRAM) 50 mg tablet Take one tablet by mouth every 4 hours as needed. 50 tablet 1   ? ultrasonic cleaner,holding sol (POWER CLEANER MISC) Use 400 mg as directed.     ? zinc sulfate 220 mg (50 mg elemental zinc) capsule Take 220 mg by mouth daily.       No current facility-administered medications on file prior to visit.         family history includes Cancer in her maternal aunt and maternal uncle; Coronary Artery Disease in her father; Diabetes in her father; Heart problem in her father; Other in her maternal grandfather; Transient Ischaemic Attack in her father.      Social History     Socioeconomic History   ? Marital status: Married Spouse name: Not on file   ? Number of children: Not on file   ? Years of education: Not on file   ? Highest education level: Not on file   Occupational History   ? Not on file   Tobacco Use   ? Smoking status: Never Smoker   ? Smokeless tobacco: Never Used   Substance and Sexual Activity   ? Alcohol use: No   ? Drug use: No   ? Sexual activity: Not on file   Other Topics Concern   ? Not on file   Social History Narrative   ? Not on file               Review of Systems   Musculoskeletal: Positive for back pain, myalgias and neck pain.   Neurological: Positive for syncope.   All other systems reviewed and are negative.        PHYSICAL EXAM:    Vitals:    06/10/20 0947   BP: (P) 110/71   BP Source: (P) Arm, Left Upper  Patient Position: (P) Sitting   Pulse: (P) 94   Resp: (P) 20   SpO2: (P) 98%   Weight: (P) 72.6 kg (160 lb)   Height: (P) 157.5 cm (5' 2)   PainSc: (P) Seven        Pain Score: (P) Seven  Body mass index is 29.26 kg/m? (pended).    General: Alert, cooperative, no acute distress.  HEENT: Normocephalic, atraumatic.  Neck: Supple.  Lungs: Unlabored respirations, bilateral and equal chest excursion.  Heart: Regular rate.  Skin: Warm and dry to touch.  Abdomen: Nondistended.    Cervical spine:  Cervical tenderness: Yes lower neck  Pain with extension: No  Pain with lateral flexion: None  Limited neck ROM: No  Sensation to light touch: Intact and equal in the bilateral upper extremities  Hoffman sign: Negative bilaterally    Upper extremity strength:   Root Right Left   Shoulder Abduction C5 5 5   Elbow Flexion C5 5 5   Elbow Extension C7 5 5   Wrist Extension C6 5 5   Finger Flexion C8 5 5   Finger Abduction T1 5 5     Upper extremity reflexes:   Right Left   Biceps 2/4 2/4   Triceps 2/4 2/4   Brachioradialis 2/4 2/4     Thoracic spine: There is mid thoracic tenderness at approximately the T8-T10 levels.  Tenderness is most prominent in bilateral paraspinal muscles.  There is slight increased tone in this region compared to the surrounding spinal levels.  There is no allodynia or skin discoloration in the region.    L spine: Upper lumbar tenderness, just below the bra strap. Pain with extension and bilateral lateral flexion.    Neurological: Alert and oriented x3.       RADIOGRAPHIC EVALUATION:    MRI C-Spine Results:    Results for orders placed during the hospital encounter of 06/02/18    MRI C-SPINE WO CONTRAST    Narrative  EXAM: MRI C-SPINE    HISTORY:    42 old female, cervical stenosis    Technique: Multiple sagittal and axial MR sequences were obtained of the cervical spine.    Comparison: C-spine radiograph May 23, 2018 and MRI C-spine December 09, 2017    FINDINGS:    Prior C6-C7 ACDF. Trace anterolisthesis of C2-C3. The vertebral body heights are maintained. There is normal marrow signal.  The cervical cord is normal in size and signal. The paraspinous soft tissues are unremarkable.    C2-C3: Trace anterolisthesis. Asymmetric right facet arthropathy. Mild right foraminal stenosis. No central stenosis or left foraminal stenosis    C3-C4: Asymmetric left uncovertebral joint enlargement and left facet arthropathy. Moderate left foraminal narrowing.    C4-C5: Uncovertebral and facet joint hypertrophy resulting in mild bilateral foraminal stenosis. No significant central spinal stenosis.    C5-C6: Mild disc osteophyte complex and uncovertebral joint enlargement greater on the left. Marked asymmetric left facet arthropathy. Mild bilateral foraminal stenosis, greater on the left.    C6-C7: Fusion across the disc space. There is asymmetric left uncovertebral joint enlargement and osteophyte formation resulting in marked left foraminal stenosis.    C7-T1: No significant spinal or neuroforaminal stenosis.    Impression  1.  Prior ACDF of C6-C7. Residual left uncovertebral joint enlargement and asymmetric osteophyte formation results in marked left foraminal stenosis at this level.  2.  Additional multiple levels with mild foraminal stenosis as discussed above. Moderate narrowing on the left at C3-4.  3.  No abnormal cord signal      MRI thoracic spine from 2019.  I reviewed these images.  There are no abnormalities.       IMPRESSION:    1. Cervical radiculopathy    2. Chronic bilateral low back pain without sciatica          PLAN:   Janielis Dolloff has worsening of pain in the left side of the neck rating to her shoulder and her arm.  I suspect this is due to cervical radiculopathy, most notably the severe foraminal stenosis at left C6-C7.  Unfortunate she is not responding to physical therapy, medications, epidural steroid injections, or cervical radiofrequency ablation.  I have asked her to return to see her neurosurgeon Dr. Earlene Plater to see if he has any options for her.  The time being I am going to start pregabalin 25 mg and titrate up to 3 times daily if she tolerates this.  This may be able to help with her low back pain as well.  I will get some plain films of the lumbar spine today to assess for any structural abnormalities.  I can follow-up with her as needed after she sees Dr. Earlene Plater.  She mentioned that she would prefer to have clinic visits with me on Thursday afternoons if possible.

## 2020-06-10 NOTE — Patient Instructions
It was nice to see you today. Thank you for choosing to visit our clinic.       Your time is important and if you had to wait today, we do apologize. Our goal is to run exactly on time; however, on occasion, we get behind in clinic due to unexpected patient issues. Thank you for your patience.    General Instructions:   How to reach me: Please send a MyChart message to the [facility] or leave a voicemail for my nurse, George at [phone]   How to get a medication refill: Please use the MyChart Refill request or contact your pharmacy directly to request medication refills. We do not do same day refills on controlled substances.   How to receive your test results: If you have signed up for MyChart, you will receive your test results and messages from me this way. Otherwise, you will get a phone call or letter. If you are expecting results and have not heard from my office within 2 weeks of your testing, please send a MyChart message or call my office.   Scheduling: Our scheduling phone number is [phone].   Appointment Reminders on your cell phone: Make sure we have your cell phone number, and Text [place] to [phone].   Support for many chronic illnesses is available through [facility]: [email or web address] or [phone].   For questions on nights, weekends or holidays, call the Operator at [phone], and ask for the doctor on call for Anesthesia Pain.      Again, thank you for coming in today.    _________________________________________________________________________________________________________

## 2020-06-17 ENCOUNTER — Encounter: Admit: 2020-06-17 | Discharge: 2020-06-17 | Payer: BC Managed Care – HMO

## 2020-06-17 ENCOUNTER — Ambulatory Visit: Admit: 2020-06-17 | Discharge: 2020-06-18 | Payer: BC Managed Care – HMO

## 2020-06-17 DIAGNOSIS — T753XXA Motion sickness, initial encounter: Secondary | ICD-10-CM

## 2020-06-17 DIAGNOSIS — G43909 Migraine, unspecified, not intractable, without status migrainosus: Secondary | ICD-10-CM

## 2020-06-17 DIAGNOSIS — L301 Dyshidrosis [pompholyx]: Secondary | ICD-10-CM

## 2020-06-17 DIAGNOSIS — G4730 Sleep apnea, unspecified: Secondary | ICD-10-CM

## 2020-06-17 DIAGNOSIS — Z0000 Encounter for general adult medical examination without abnormal findings: Principal | ICD-10-CM

## 2020-06-17 DIAGNOSIS — J45909 Unspecified asthma, uncomplicated: Secondary | ICD-10-CM

## 2020-06-17 DIAGNOSIS — R0902 Hypoxemia: Secondary | ICD-10-CM

## 2020-06-17 DIAGNOSIS — M797 Fibromyalgia: Secondary | ICD-10-CM

## 2020-06-17 DIAGNOSIS — N200 Calculus of kidney: Secondary | ICD-10-CM

## 2020-06-17 DIAGNOSIS — R4189 Other symptoms and signs involving cognitive functions and awareness: Secondary | ICD-10-CM

## 2020-06-17 MED ORDER — ALBUTEROL SULFATE 90 MCG/ACTUATION IN HFAA
1-2 | RESPIRATORY_TRACT | 1 refills | Status: AC | PRN
Start: 2020-06-17 — End: ?

## 2020-06-17 MED ORDER — HYDROCORTISONE 2.5 % TP OINT
Freq: Two times a day (BID) | TOPICAL | 1 refills | 30.00000 days | Status: AC | PRN
Start: 2020-06-17 — End: ?

## 2020-06-17 MED ORDER — DEPO-ESTRADIOL 5 MG/ML IM OIL
.4 mg | INTRAMUSCULAR | 0 refills | 43.00000 days | Status: AC
Start: 2020-06-17 — End: ?

## 2020-07-08 ENCOUNTER — Encounter: Admit: 2020-07-08 | Discharge: 2020-07-08 | Payer: BC Managed Care – HMO

## 2020-07-21 ENCOUNTER — Encounter: Admit: 2020-07-21 | Discharge: 2020-07-21 | Payer: BC Managed Care – HMO

## 2020-07-21 DIAGNOSIS — G4730 Sleep apnea, unspecified: Secondary | ICD-10-CM

## 2020-07-21 DIAGNOSIS — J45909 Unspecified asthma, uncomplicated: Secondary | ICD-10-CM

## 2020-07-21 DIAGNOSIS — G43909 Migraine, unspecified, not intractable, without status migrainosus: Secondary | ICD-10-CM

## 2020-07-21 DIAGNOSIS — R4189 Other symptoms and signs involving cognitive functions and awareness: Secondary | ICD-10-CM

## 2020-07-21 DIAGNOSIS — R0902 Hypoxemia: Secondary | ICD-10-CM

## 2020-07-21 DIAGNOSIS — M797 Fibromyalgia: Secondary | ICD-10-CM

## 2020-07-21 DIAGNOSIS — M542 Cervicalgia: Secondary | ICD-10-CM

## 2020-07-21 DIAGNOSIS — T753XXA Motion sickness, initial encounter: Secondary | ICD-10-CM

## 2020-07-21 DIAGNOSIS — L301 Dyshidrosis [pompholyx]: Secondary | ICD-10-CM

## 2020-07-21 DIAGNOSIS — N200 Calculus of kidney: Secondary | ICD-10-CM

## 2020-07-23 ENCOUNTER — Ambulatory Visit: Admit: 2020-07-23 | Discharge: 2020-07-23 | Payer: BC Managed Care – HMO

## 2020-07-23 ENCOUNTER — Encounter: Admit: 2020-07-23 | Discharge: 2020-07-23 | Payer: BC Managed Care – HMO

## 2020-07-23 DIAGNOSIS — J45909 Unspecified asthma, uncomplicated: Secondary | ICD-10-CM

## 2020-07-23 DIAGNOSIS — N200 Calculus of kidney: Secondary | ICD-10-CM

## 2020-07-23 DIAGNOSIS — M542 Cervicalgia: Secondary | ICD-10-CM

## 2020-07-23 DIAGNOSIS — G4730 Sleep apnea, unspecified: Secondary | ICD-10-CM

## 2020-07-23 DIAGNOSIS — M5416 Radiculopathy, lumbar region: Secondary | ICD-10-CM

## 2020-07-23 DIAGNOSIS — Z981 Arthrodesis status: Secondary | ICD-10-CM

## 2020-07-23 DIAGNOSIS — R0902 Hypoxemia: Secondary | ICD-10-CM

## 2020-07-23 DIAGNOSIS — M4722 Other spondylosis with radiculopathy, cervical region: Secondary | ICD-10-CM

## 2020-07-23 DIAGNOSIS — G43909 Migraine, unspecified, not intractable, without status migrainosus: Secondary | ICD-10-CM

## 2020-07-23 DIAGNOSIS — R4189 Other symptoms and signs involving cognitive functions and awareness: Secondary | ICD-10-CM

## 2020-07-23 DIAGNOSIS — L301 Dyshidrosis [pompholyx]: Secondary | ICD-10-CM

## 2020-07-23 DIAGNOSIS — T753XXA Motion sickness, initial encounter: Secondary | ICD-10-CM

## 2020-07-23 DIAGNOSIS — M797 Fibromyalgia: Secondary | ICD-10-CM

## 2020-07-23 NOTE — Progress Notes
Subjective:       History of Present Illness  Ashley Gaines is a 55 y.o. female.  He presents chief complaint of neck pain with radiation to her left shoulder and upper extremity in a C6 dermatomal pattern.  She also has low back pain that radiates down her lateral left lower extremity.  The symptoms have been present for approximately 4 to 5 months.  Her symptoms are worse with activity, Valsalva, and cold.  Her symptoms are somewhat better with heat.  She has failed nonoperative management physical therapy, muscle relaxants, pain medications, and injections (both cervical and lumbar).             Objective:         ? albuterol sulfate (PROAIR HFA) 90 mcg/actuation HFA aerosol inhaler Inhale one puff to two puffs by mouth into the lungs every 6 hours as needed.   ? ascorbic acid (VITAMIN C) 500 mg tablet Take 1 tablet by mouth twice daily.   ? baclofen (LIORESAL) 10 mg tablet    ? cholecalciferol (VITAMIN D-3) 1,000 units tablet Take 1,000 Units by mouth daily.   ? Cyanocobalamin-Cobamamide (B12) 5,000-100 mcg lozg Place  under tongue.   ? docusate (COLACE) 100 mg capsule Take one capsule by mouth twice daily as needed for Constipation.   ? duloxetine DR (CYMBALTA) 60 mg capsule Take 60 mg by mouth twice daily.   ? estradiol cypionate (DEPO-ESTRADIOL) 5 mg/mL injection Inject 0.08 mL into the muscle every 28 days. Every 19 days   ? famotidine (PEPCID) 20 mg tablet Take 20 mg by mouth twice daily.   ? fexofenadine(+) (ALLEGRA) 180 mg tablet Take 180 mg by mouth at bedtime daily.   ? fludrocortisone (FLORINEF) 0.1 mg tablet Take one tablet by mouth daily.   ? hydrocortisone 2.5 % topical ointment Apply  topically to affected area twice daily as needed.   ? MAGNESIUM PO Take 1 tablet by mouth daily. 100mg    ? methocarbamoL (ROBAXIN) 750 mg tablet Take one tablet by mouth three times daily as needed for Spasms.   ? omeprazole DR (PRILOSEC) 20 mg capsule Take 20 mg by mouth twice daily.   ? other medication Journey Bariastic Multivitamin 1 tablet by mouth twice daily   ? Potassium 99 mg tab Take  by mouth.   ? pregabalin (LYRICA) 25 mg capsule 1 po qhs x3 days, then 1 po bid x3 days, then 1 po tid.   ? ultrasonic cleaner,holding sol (POWER CLEANER MISC) Use 400 mg as directed.   ? zinc sulfate 220 mg (50 mg elemental zinc) capsule Take 220 mg by mouth daily.     Vitals:    07/23/20 1303   BP: 122/72   BP Source: Arm, Left Upper   Pulse: 89   Temp: 36.9 ?C (98.5 ?F)   SpO2: 97%   PainSc: Six   Weight: 73.2 kg (161 lb 6.4 oz)   Height: 157.5 cm (5' 2)     Body mass index is 29.52 kg/m?Marland Kitchen     Physical Exam  Vitals and nursing note reviewed.   Constitutional:       Appearance: Normal appearance.   HENT:      Head: Normocephalic and atraumatic.   Pulmonary:      Effort: Pulmonary effort is normal.   Musculoskeletal:      Cervical back: Tenderness present.      Thoracic back: Tenderness present.      Lumbar back: No tenderness.  Skin:     General: Skin is warm and dry.   Neurological:      General: No focal deficit present.      Mental Status: She is alert and oriented to person, place, and time.      Sensory: Sensation is intact.      Motor: Motor function is intact.      Gait: Gait is intact.      Deep Tendon Reflexes: Reflexes are normal and symmetric.   Psychiatric:         Mood and Affect: Mood normal.         Behavior: Behavior normal.     I independently reviewed cervical films from clinic today which demonstrate prior C6-7 ACDF with solid bony fusion.  There is no significant abnormal motion on flexion/extension views.         Assessment and Plan:     Patient has cervical and lumbar radiculopathies that have failed appropriate nonoperative management.  I am ordering MRIs of the cervical and lumbar spine to further evaluate the cause of her symptoms.  Once have been able to review these MRIs we will contact her with any further recommendations.

## 2020-07-24 ENCOUNTER — Encounter: Admit: 2020-07-24 | Discharge: 2020-07-24 | Payer: BC Managed Care – HMO

## 2020-07-24 DIAGNOSIS — M5020 Other cervical disc displacement, unspecified cervical region: Secondary | ICD-10-CM

## 2020-07-24 NOTE — Progress Notes
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   David Stall, RN

## 2020-08-18 ENCOUNTER — Encounter: Admit: 2020-08-18 | Discharge: 2020-08-18 | Payer: BC Managed Care – HMO

## 2020-08-18 DIAGNOSIS — M4722 Other spondylosis with radiculopathy, cervical region: Secondary | ICD-10-CM

## 2020-08-18 DIAGNOSIS — M5416 Radiculopathy, lumbar region: Secondary | ICD-10-CM

## 2020-08-18 DIAGNOSIS — Z981 Arthrodesis status: Secondary | ICD-10-CM

## 2020-09-09 ENCOUNTER — Encounter: Admit: 2020-09-09 | Discharge: 2020-09-09 | Payer: BC Managed Care – HMO

## 2020-09-09 ENCOUNTER — Ambulatory Visit: Admit: 2020-09-09 | Discharge: 2020-09-09 | Payer: BC Managed Care – HMO

## 2020-09-09 DIAGNOSIS — L301 Dyshidrosis [pompholyx]: Secondary | ICD-10-CM

## 2020-09-09 DIAGNOSIS — N200 Calculus of kidney: Secondary | ICD-10-CM

## 2020-09-09 DIAGNOSIS — R0902 Hypoxemia: Secondary | ICD-10-CM

## 2020-09-09 DIAGNOSIS — T753XXA Motion sickness, initial encounter: Secondary | ICD-10-CM

## 2020-09-09 DIAGNOSIS — M5412 Radiculopathy, cervical region: Secondary | ICD-10-CM

## 2020-09-09 DIAGNOSIS — M797 Fibromyalgia: Secondary | ICD-10-CM

## 2020-09-09 DIAGNOSIS — G43909 Migraine, unspecified, not intractable, without status migrainosus: Secondary | ICD-10-CM

## 2020-09-09 DIAGNOSIS — R4189 Other symptoms and signs involving cognitive functions and awareness: Secondary | ICD-10-CM

## 2020-09-09 DIAGNOSIS — J45909 Unspecified asthma, uncomplicated: Secondary | ICD-10-CM

## 2020-09-09 DIAGNOSIS — G4730 Sleep apnea, unspecified: Secondary | ICD-10-CM

## 2020-09-09 NOTE — Patient Instructions
Preparing for your exam:    ? Please shower/bathe prior to your exam.  Do not apply any creams, lotions or oils to your skin after showering/bathing.    ? Eat a normal meal prior to your exam.    ? Time your pain medication so that you can take a dose prior to your exam.    ? After the exam, you can resume all normal activities including driving.    ? Please notify our office if you have a pacemaker or other implanted device.    ? Please notify the doctor performing the test if you are taking Coumadin (warfarin), Plavix, Aspirin or blood thinning medications, but do not stop any of these medications prior to the study unless specifically instructed.     ? While a family, friend or caregiver may accompany you to the exam; they will not be permitted in the EMG room during testing.    What you should know about your EMG and NCS:  1) Why are you having an EMG/NCS?   Having an EMG/NCS performed can give further explanation to your doctor as to why you are experiencing; numbness, tingling, weakness or muscle cramping.  The doctor performing your exam will determine which test to do.    2) What does NCS show?  NCS shows how well electrical signals are traveling to a nerve.  This is done by applying small electrical shocks to the nerve and recording how the nerve works.  The shocks cause a quick, mild tingling feeling.  The doctor may test several nerves.    3) What happens during a needle EMG?  A small, thin needle is put in several muscles to see if there are any problems. There may be a small amount of pain when the needle is placed.  A new needle is used with every patient.  The doctor will only test the muscles necessary to decide what is wrong.  The doctor will look & listen to the electronic signals traveling from the needle to the EMG machine.  The doctor will then evaluate the results & report the findings to your referring doctor.    4) How long will the EMG/NCS take? When do I get my results?  Plan roughly 45-60 min. for the exam.  There are no lasting side effects from these exams.  Your referring doctor will receive the test results.  Therefore, you will be contacting his/hers office to discuss the results & your plan of care.    The doctor will answer any additional questions you may have at the time of the exam.  Thank you!

## 2020-09-09 NOTE — Progress Notes
Date of Service: 09/09/2020     Subjective:               Ashley Gaines Rudi Knippenberg is a 55 y.o. female.        History of Present Illness    Our last visit was in December 2021 for dizziness.  I started Florinef 0.1 mg daily for syncope.  We talked about importance of gradual position change, salt intake and behavioral modifications.    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    Since last time, dizziness is better.  Tilt table test and EEG historically benign.  CT angiogram of the neck showed spondylotic changes but no vascular anomaly.    Dizziness is better.  She has had no other episodes of syncope.  Florinef has helped.  She has tolerated it well.    Neck pain is worse.  She saw pain management who told her that there is nothing more that they could do about it.  She sees Dr. Earlene Plater of neurosurgery now who ordered an MRI of the neck.    She has had numbness in the lateral aspect of the left upper and lower arm.  Goes into thumb of the left hand.  She has had some weakness.  She failed physical therapy.  She has tried both Lyrica and duloxetine.    Cervical MRI June 2022: Prior C6-7 ACDF.  Multilevel mild central stenosis.  Multiple degenerative foraminal stenosis marked left C6-7.    Lumbar MRI August 18, 2020 lumbar degenerative disease with multiple levels of central stenosis and at least moderate bilateral recess stenosis L4-L5 and L3-L4.  Multilevel degenerative foraminal stenosis marked right L4-L5.    She has had no other new neurological concerns or complaints.       Chief Complaint:  Chief Complaint   Patient presents with   ? Follow Up     Improved dizziness       Past Medical History:  Medical History:   Diagnosis Date   ? Asthma     allergies and exercise induced   ? Cognitive impairment since 08/2008    /memory; most likely 2/2 adjustment reaction with depressive sx's as  she had a prolonged period of illness after sx   ? Dyshidrotic eczema    ? Fibromyalgia ? Hypoxia 2011    post procedural pain medicine   ? Kidney stones 1984   ? Migraines    ? Motion sickness    ? Sleep apnea     uses CPAP machine       Surgical History:  Surgical History:   Procedure Laterality Date   ? APPENDECTOMY  1983   ? HX HYSTERECTOMY  1984   ? CHOLECYSTECTOMY  2002   ? CARPAL TUNNEL RELEASE Bilateral 2004   ? HAND SURGERY Left 2004    thumb   ? LYSIS OF ADHESIONS  2011    abdominal adhesiolysis surgery   ? KNEE SURGERY Left 2014    meniscus repair   ? KIDNEY STONE SURGERY  05/2017    7mm stone removed   ? RIGHT ACHILLES TAKEDOWN AND REPAIR Right 05/18/2017    Performed by Tanja Port, [facility] at [facility]   ? REPAIR ACHILLES TENDON Left 12/22/2017    Performed by Vopat, Lowry Ram, MD at [facility]   ? PLANTAR FASCIA RELEASE Left 12/22/2017    Performed by Vopat, Lowry Ram, MD at [facility]   ? FUSION SPINE ANTERIOR  WITH DISCECTOMY/ DECOMPRESSION - CERVICAL 6-7 WITH ANTERIOR SPINAL INSTRUMENTATION Right 03/06/2018    Performed by Addison Lank, MD at [facility]   ? MICROSURGICAL TECHNIQUES - REQUIRING OPERATING MICROSCOPE USE N/A 03/06/2018    Performed by Addison Lank, MD at [facility]   ? COLONOSCOPY     ? GASTRIC BYPASS      12/2017   ? HYSTEROSCOPY      endometriosis   ? SHOULDER SURGERY Bilateral 2003, 2004       Social History:   Social History     Socioeconomic History   ? Marital status: Married   Tobacco Use   ? Smoking status: Never Smoker   ? Smokeless tobacco: Never Used   Substance and Sexual Activity   ? Alcohol use: No   ? Drug use: No             Family History:  Family History   Problem Relation Age of Onset   ? Coronary Artery Disease Father    ? Diabetes Father    ? Transient Ischaemic Attack Father    ? Heart problem Father    ? Other Maternal Grandfather         memory decline at an old age   ? Cancer Maternal Aunt    ? Cancer Maternal Uncle        Allergies:  Allergies   Allergen Reactions   ? Adhesive Tape (Rosins) RASH   ? Bee [Venom-Honey Bee] EDEMA     carries epipen   ? Latex RASH   ? Succinylcholine MUSCLE PAIN   ? Benadryl [Diphenhydramine Hcl] SEE COMMENTS     restless leg syndrome   ? Chromium REDNESS   ? Codeine NAUSEA AND VOMITING   ? Hydrocodone ITCHING   ? Metoclopramide STOMACH UPSET   ? Morphine NAUSEA AND VOMITING   ? Oxycodone ITCHING     tolerates with antihistamine       Objective:         ? albuterol sulfate (PROAIR HFA) 90 mcg/actuation HFA aerosol inhaler Inhale one puff to two puffs by mouth into the lungs every 6 hours as needed.   ? ascorbic acid (VITAMIN C) 500 mg tablet Take 1 tablet by mouth twice daily.   ? baclofen (LIORESAL) 10 mg tablet    ? cholecalciferol (VITAMIN D-3) 1,000 units tablet Take 1,000 Units by mouth daily.   ? Cyanocobalamin-Cobamamide (B12) 5,000-100 mcg lozg Place  under tongue.   ? docusate (COLACE) 100 mg capsule Take one capsule by mouth twice daily as needed for Constipation.   ? duloxetine DR (CYMBALTA) 60 mg capsule Take 60 mg by mouth twice daily.   ? estradiol cypionate (DEPO-ESTRADIOL) 5 mg/mL injection Inject 0.08 mL into the muscle every 28 days. Every 19 days   ? famotidine (PEPCID) 20 mg tablet Take 20 mg by mouth twice daily.   ? fexofenadine(+) (ALLEGRA) 180 mg tablet Take 180 mg by mouth at bedtime daily.   ? fludrocortisone (FLORINEF) 0.1 mg tablet Take one tablet by mouth daily.   ? hydrocortisone 2.5 % topical ointment Apply  topically to affected area twice daily as needed.   ? MAGNESIUM PO Take 1 tablet by mouth daily. 100mg    ? methocarbamoL (ROBAXIN) 750 mg tablet Take one tablet by mouth three times daily as needed for Spasms.   ? omeprazole DR (PRILOSEC) 20 mg capsule Take 20 mg by mouth twice daily.   ? other medication Journey Bariastic Multivitamin 1 tablet  by mouth twice daily   ? Potassium 99 mg tab Take  by mouth.   ? pregabalin (LYRICA) 25 mg capsule 1 po qhs x3 days, then 1 po bid x3 days, then 1 po tid.   ? ultrasonic cleaner,holding sol (POWER CLEANER MISC) Use 400 mg as directed.   ? zinc sulfate 220 mg (50 mg elemental zinc) capsule Take 220 mg by mouth daily.     Vitals:    09/09/20 1324   BP: 114/76   BP Source: Arm, Left Upper   Pulse: 87   Temp: 36.8 ?C (98.3 ?F)   Resp: 18   SpO2: 95%   TempSrc: Oral   PainSc: Nine   Weight: 73.9 kg (163 lb)   Height: 157.5 cm (5' 2)     Body mass index is 29.81 kg/m?Marland Kitchen       Physical Exam    General: WG/WN/WD, calm, cooperative, follows commands  HEENT: NC/AT  Cardiac: RRR without murmur  Neck: supple, full ROM  ?  Speech: fluent, no dysarthria or aphasia  Mental status: Alert, oriented x 4, NAD  ?  CN: PERRL, EOMI without restriction or nystagmus, facial sensation symmetric (V1-V3) to LT bilaterally, No facial droop or ptosis, hearing equal to FR, palatal rise symmetric, shoulder shrug symmetric, tongue midline  ?  Strength:   UEs:??????5/5- SA/EF/SExtRot, 5/5 EE/WE/WF, 5/4 FA, 5-/5- TA  LEs:??????5/5 HF/KE/DF/PF  No involuntary movements, no tremor  ?  Sensation: LT decreased left C5 and 6 territories   ?  DTRs: 2/2 in BR/B/P?1/1A, downgoing plantar reflexes bilaterally?  Gait: deferred  ?       Assessment and Plan:    1.  Neurocardiogenic syncope  2.  Cervical spondylosis with left cervical radiculopathy    Plan:  1.  EMG/NCS LUE  2.  Agree with neurosurgical evaluation.  Has history of ACDF.  Prominent degenerative changes in cervical spine evident on imaging.  Has failed nonoperative management and physical therapy, oral medications and injections via pain management.  3.  Defer medications for now for radiculopathy.  Would like to confirm first on EMG, and see neurosurgical recommendations.  4.  RTC 4 months.  Follow-up with primary care provider for neurocardiogenic syncope/dizziness.

## 2020-09-25 ENCOUNTER — Encounter: Admit: 2020-09-25 | Discharge: 2020-09-25 | Payer: BC Managed Care – HMO

## 2020-09-25 DIAGNOSIS — M5412 Radiculopathy, cervical region: Secondary | ICD-10-CM

## 2020-09-25 MED ORDER — PREGABALIN 25 MG PO CAP
25 mg | ORAL_CAPSULE | Freq: Three times a day (TID) | ORAL | 5 refills | Status: AC
Start: 2020-09-25 — End: ?

## 2020-09-25 NOTE — Telephone Encounter
Pharmacy refill request for Pregabalin 25 mg PO  LV:06/10/20  LR:08/05/20

## 2020-10-03 ENCOUNTER — Encounter: Admit: 2020-10-03 | Discharge: 2020-10-03 | Payer: BC Managed Care – HMO

## 2020-10-03 DIAGNOSIS — Z0000 Encounter for general adult medical examination without abnormal findings: Secondary | ICD-10-CM

## 2020-10-05 ENCOUNTER — Encounter: Admit: 2020-10-05 | Discharge: 2020-10-05 | Payer: BC Managed Care – HMO

## 2020-10-05 DIAGNOSIS — R69 Illness, unspecified: Secondary | ICD-10-CM

## 2020-10-25 ENCOUNTER — Encounter: Admit: 2020-10-25 | Discharge: 2020-10-25 | Payer: BC Managed Care – HMO

## 2020-10-25 MED ORDER — ALBUTEROL SULFATE 90 MCG/ACTUATION IN HFAA
Freq: Four times a day (QID) | 1 refills | PRN
Start: 2020-10-25 — End: ?

## 2020-11-12 ENCOUNTER — Encounter: Admit: 2020-11-12 | Discharge: 2020-11-12 | Payer: BC Managed Care – HMO

## 2020-11-13 ENCOUNTER — Encounter: Admit: 2020-11-13 | Discharge: 2020-11-13 | Payer: BC Managed Care – HMO

## 2020-11-13 ENCOUNTER — Ambulatory Visit: Admit: 2020-11-13 | Discharge: 2020-11-13 | Payer: BC Managed Care – HMO

## 2020-11-13 DIAGNOSIS — T753XXA Motion sickness, initial encounter: Secondary | ICD-10-CM

## 2020-11-13 DIAGNOSIS — G43909 Migraine, unspecified, not intractable, without status migrainosus: Secondary | ICD-10-CM

## 2020-11-13 DIAGNOSIS — M79602 Pain in left arm: Secondary | ICD-10-CM

## 2020-11-13 DIAGNOSIS — L301 Dyshidrosis [pompholyx]: Secondary | ICD-10-CM

## 2020-11-13 DIAGNOSIS — R0902 Hypoxemia: Secondary | ICD-10-CM

## 2020-11-13 DIAGNOSIS — G4730 Sleep apnea, unspecified: Secondary | ICD-10-CM

## 2020-11-13 DIAGNOSIS — J45909 Unspecified asthma, uncomplicated: Secondary | ICD-10-CM

## 2020-11-13 DIAGNOSIS — R4189 Other symptoms and signs involving cognitive functions and awareness: Secondary | ICD-10-CM

## 2020-11-13 DIAGNOSIS — N200 Calculus of kidney: Secondary | ICD-10-CM

## 2020-11-13 DIAGNOSIS — M797 Fibromyalgia: Secondary | ICD-10-CM

## 2020-11-13 NOTE — Procedures
Please refer to scanned EMG/NCS report for study results.  No separate note otherwise placed for procedure.      Patient well tolerated the procedure and was sent home with no complications.  Time out was taken today to confirm patient identity.      Patient will followup with referring provider for further instructions and workup as needed.

## 2020-12-04 ENCOUNTER — Encounter: Admit: 2020-12-04 | Discharge: 2020-12-04 | Payer: BC Managed Care – HMO

## 2020-12-10 ENCOUNTER — Encounter: Admit: 2020-12-10 | Discharge: 2020-12-10 | Payer: BC Managed Care – HMO

## 2020-12-15 ENCOUNTER — Ambulatory Visit: Admit: 2020-12-15 | Discharge: 2020-12-15 | Payer: BC Managed Care – HMO

## 2020-12-15 ENCOUNTER — Encounter: Admit: 2020-12-15 | Discharge: 2020-12-15 | Payer: BC Managed Care – HMO

## 2020-12-15 DIAGNOSIS — J45909 Unspecified asthma, uncomplicated: Secondary | ICD-10-CM

## 2020-12-15 DIAGNOSIS — G43909 Migraine, unspecified, not intractable, without status migrainosus: Secondary | ICD-10-CM

## 2020-12-15 DIAGNOSIS — L301 Dyshidrosis [pompholyx]: Secondary | ICD-10-CM

## 2020-12-15 DIAGNOSIS — R0902 Hypoxemia: Secondary | ICD-10-CM

## 2020-12-15 DIAGNOSIS — G4730 Sleep apnea, unspecified: Secondary | ICD-10-CM

## 2020-12-15 DIAGNOSIS — T753XXA Motion sickness, initial encounter: Secondary | ICD-10-CM

## 2020-12-15 DIAGNOSIS — M797 Fibromyalgia: Secondary | ICD-10-CM

## 2020-12-15 DIAGNOSIS — R4189 Other symptoms and signs involving cognitive functions and awareness: Secondary | ICD-10-CM

## 2020-12-15 DIAGNOSIS — F321 Major depressive disorder, single episode, moderate: Secondary | ICD-10-CM

## 2020-12-15 DIAGNOSIS — N200 Calculus of kidney: Secondary | ICD-10-CM

## 2020-12-15 DIAGNOSIS — K760 Fatty (change of) liver, not elsewhere classified: Secondary | ICD-10-CM

## 2020-12-15 DIAGNOSIS — K589 Irritable bowel syndrome without diarrhea: Secondary | ICD-10-CM

## 2020-12-15 NOTE — Progress Notes
Total Psychometrist face-to-face time with patient was 86 minutes.

## 2020-12-15 NOTE — Progress Notes
Neuropsychological Evaluation    Name: Ashley Gaines    MRN: [id number]    Date of Birth (age): 28-Jan-1966 (55 y.o.)    Date of Service: 12/15/2020    Referring Provider:  Laveda Abbe, DO    Reason for Referral: The patient is a 55 year old female referred for neuropsychological evaluation due to reported cognitive change. She was seen by this provider in October 2018 with testing being within normal limits. She also mentioned having completed two other normal neuropsychological evaluations.    Chief Complaint: Cognitive change    Reported Symptoms: During the interview, the patient reported trouble with everyday things giving examples such as inability to recall if she has completed a task, driving past her destination, difficulty with recalling where she parked, word finding/substitution problems, not understanding jokes, and difficulty tolerating groups.    ADL/IADL: The patient indicated she does not drive alone often due to trouble with navigation. She indicated other abilities are grossly intact with increased compensatory strategies.    Duration/Course: The patient reported onset of cognitive changes following dilaudid overdosed in 2011 (was given too much when in the hospital recovering from surgery) with decline over time. She noted having good and bad days associated with sleep quality and previous day's activity level (performance is worse after a busy day or poor sleep).    Medical History:   Medical diagnoses:  ? Asthma     allergies and exercise induced   ? Dyshidrotic eczema    ? Fatty liver    ? Fibromyalgia    ? IBS (irritable bowel syndrome)    ? Migraines    ? Sleep apnea     uses CPAP machine   Surgical History/Hospitalizations: The patient reported history of dilaudid overdosed in 2011 that caused respiratory distress when in the hospital recovering from abdominal adhesions/hernia repair surgery. She also noted history of kidney stones and lithotripsy as well as the following surgeries:  Procedure Laterality Date   ? APPENDECTOMY  1983   ? HX HYSTERECTOMY  1984   ? CHOLECYSTECTOMY  2002   ? CARPAL TUNNEL RELEASE Bilateral 2004   ? HAND SURGERY Left 2004    thumb   ? LYSIS OF ADHESIONS  2011    abdominal adhesiolysis surgery   ? KNEE SURGERY Left 2014    meniscus repair   ? KIDNEY STONE SURGERY  05/2017    7mm stone removed   ? RIGHT ACHILLES TAKEDOWN AND REPAIR Right 05/18/2017    Performed by Tanja Port, MD at [facility]   ? REPAIR ACHILLES TENDON Left 12/22/2017    Performed by Vopat, Lowry Ram, MD at [facility]   ? PLANTAR FASCIA RELEASE Left 12/22/2017    Performed by Vopat, Lowry Ram, MD at [facility]   ? FUSION SPINE ANTERIOR WITH DISCECTOMY/ DECOMPRESSION - CERVICAL 6-7 WITH ANTERIOR SPINAL INSTRUMENTATION Right 03/06/2018   ? GASTRIC BYPASS  12/2017   ? HYSTEROSCOPY      endometriosis   ? SHOULDER SURGERY Bilateral 2003, 2004   Pain: back/neck pain and fibromyalgia - rated typical pain as 6-7/10 and noted pain increases with activity  Sleep: The patient estimated sleeping 8-10 hours per night with 3 brief awakenings. She acknowledged sleep apnea and noted she stopped using her CPAP due to recall and has not resumed use. Her husband noted the patient is very active in her sleep with possible dream enactment.  Psychological Functioning: The patient noted sometimes feeling down due to not having a  definitive diagnosis for memory problems or neck pain but denied psychological problems requiring treatment. She noted use of some medications for pain that are also antidepressants.  Substance Use: denied history of substance use disorder    Family Medical History: vascular disease in father, memory decline at old age in maternal grandfather    Relevant Studies:  Lab Results   Component Value Date/Time    NA 137 01/16/2020 01:25 PM    K 4.5 01/16/2020 01:25 PM    CL 102 01/16/2020 01:25 PM    CO2 24 01/16/2020 01:25 PM    GAP 11 01/16/2020 01:25 PM    BUN 19 01/16/2020 01:25 PM    CR 0.95 01/16/2020 01:25 PM    GLU 88 01/16/2020 01:25 PM    CA 9.7 01/16/2020 01:25 PM    ALBUMIN 4.6 01/16/2020 01:25 PM    TOTPROT 7.6 01/16/2020 01:25 PM    ALKPHOS 66 01/16/2020 01:25 PM    AST 33 01/16/2020 01:25 PM    ALT 23 01/16/2020 01:25 PM    TOTBILI 0.7 01/16/2020 01:25 PM    GFR >60 01/16/2020 01:25 PM    GFRAA >60 01/16/2020 01:25 PM     Lab Results   Component Value Date/Time    TSH 1.50 03/19/2020 10:36 AM    VITB12 912 03/19/2020 10:36 AM     Medications:  ? albuterol sulfate (PROAIR HFA) 90 mcg/actuation HFA aerosol inhaler INHALE 1-2 PUFFS BY MOUTH EVERY 6 HOURS AS NEEDED   ? ascorbic acid (VITAMIN C) 500 mg tablet Take 1 tablet by mouth twice daily.   ? baclofen (LIORESAL) 10 mg tablet    ? cholecalciferol (VITAMIN D-3) 1,000 units tablet Take 1,000 Units by mouth daily.   ? Cyanocobalamin-Cobamamide (B12) 5,000-100 mcg lozg Place  under tongue.   ? docusate (COLACE) 100 mg capsule Take one capsule by mouth twice daily as needed for Constipation.   ? duloxetine DR (CYMBALTA) 60 mg capsule Take 60 mg by mouth twice daily.   ? estradiol cypionate (DEPO-ESTRADIOL) 5 mg/mL injection Inject 0.08 mL into the muscle every 28 days. Every 19 days   ? famotidine (PEPCID) 20 mg tablet Take 20 mg by mouth twice daily.   ? fexofenadine(+) (ALLEGRA) 180 mg tablet Take 180 mg by mouth at bedtime daily.   ? fludrocortisone (FLORINEF) 0.1 mg tablet Take one tablet by mouth daily.   ? hydrocortisone 2.5 % topical ointment Apply  topically to affected area twice daily as needed.   ? MAGNESIUM PO Take 1 tablet by mouth daily. 100mg    ? methocarbamoL (ROBAXIN) 750 mg tablet Take one tablet by mouth three times daily as needed for Spasms.   ? omeprazole DR (PRILOSEC) 20 mg capsule Take 20 mg by mouth twice daily.   ? other medication Journey Bariastic Multivitamin 1 tablet by mouth twice daily   ? Potassium 99 mg tab Take  by mouth.   ? pregabalin (LYRICA) 25 mg capsule Take one capsule by mouth three times daily.   ? ultrasonic cleaner,holding sol (POWER CLEANER MISC) Use 400 mg as directed.   ? zinc sulfate 220 mg (50 mg elemental zinc) capsule Take 220 mg by mouth daily.     Social/Developmental History:  Developmental: The patient denied known problems with development.  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  Relationships: married since 1990, no children    Neurobehavioral Status Exam:  The patient arrived on time to the appointment and was accompanied by her husband. Grooming and dress were appropriate. Ambulation was within normal limits and no abnormal movements were noted. The patient was alert. Speech was within normal limits for volume, rate, and prosody and no problems with comprehension were noted. Thought processes were grossly logical and goal directed. The patient was able to provide appropriate information regarding history and presenting concern that was generally corroborated by her husband. No abnormal behaviors were noted during testing that were thought to impact performance.    Test List: Reola Calkins Depression Inventory - 2, Benton Facial Recognition Test, Brief Test of Attention, Brief Visuospatial Memory Test - Revised, Calibrated Ideational Fluency Assessment, CNNS [place] Naming Test, Epworth Sleepiness Scale, Hopkins Adult Reading Test, [employer] Verbal Learning Test - Revised, Modified [employer], Performance Validity Testing, Rey Osterrieth Complex Figure Test, [place] Perceptual Comparison Test, State Trait Anxiety Inventory, Trail Making Test, WAIS-III Digit Span, WMS-IV Logical Memory    Test Results:  Cognitive: Performance on cognitive testing showed intact abilities across measured cognitive domains with all scores falling within normal limits.    Self-report Inventories: The patient endorsed items suggesting moderate depression and elevated situational and characterological anxiety. Endorsed excessive daytime sleepiness was within normal limits.    Summary and Conclusions:  The patient is a 54 year old female referred for neuropsychological evaluation due to reported cognitive change. She was seen by this provider in October 2018 with testing being within normal limits. She also mentioned having completed two other normal neuropsychological evaluations. During the current appointment, the patient reported trouble with everyday things giving examples such as inability to recall if she has completed a task, driving past her destination, difficulty with recalling where she parked, word finding/substitution problems, not understanding jokes, and difficulty tolerating groups. The patient reported onset of cognitive changes following dilaudid overdosed in 2011 (was given too much when in the hospital recovering from surgery) with decline over time. She noted having good and bad days associated with sleep quality and previous day's activity level (performance is worse after a busy day or poor sleep).    Performance on cognitive testing suggested intact abilities across cognitive domains without notable change from the 2018 evaluation. It is considered likely that psychological factors, chronic pain, and sleep apnea are contributing to reported cognitive decline.    Recommendations:  ? The patient is encouraged to seek treatment for depression/anxiety considering both medication and counseling.  ? The patient is encouraged to resume treatment of sleep apnea. She may need assistance from her providers in securing a new CPAP as hers was recalled.  ? Continued work to optimize management of pain symptoms is recommended.  The patient may benefit from learning behavioral strategies for controlling pain in addition to treatment with medications.  ? The following compensatory strategies may be useful if not already employed: using lists, a calendar, and other reminders to decrease reliance on memory, taking notes and checking with others to ensure that she has correctly identified the main points when discussing important information, limiting distractions in the environment while completing cognitively taxing tasks, breaking large tasks into smaller pieces, taking frequent breaks while working, and increasing organization and structure both physically (e.g., having a specific place to put important items) and in daily routine.  ? The patient  is encouraged to engage in pleasant activities, particularly those involving physical exercise (as recommended by physicians) and social components as this has been linked with improved physical, emotional, and cognitive wellbeing.    Thank-you for the referral of this patient. Please contact me at [phone] for further information as needed.    Documentation time: Total time for this evaluation included 86 minutes of neuropsychological test administration and scoring by technician, two or more tests, any method; 29 minutes of neuropsychological test administration and scoring by neuropsychologist; 37 minutes of neuropsychologist time in interview/neurobehavioral status examination, and 70 minutes of neuropsychological testing evaluation services by neuropsychologist time including integration patient data, interpretation of standardized test results and clinical data, clinical decision-making, treatment planning and report and interactive feedback to the patient, family member(s) or caregiver(s).    TEST RESULTS TABLE:  (provided for professional use - for interpretation, please see summary and impressions sections)     October 2018 Evaluation Current Evaluation   MSVT Raw   Raw     IR 85   100     DR 75   100     CNS 80   100     PA 70   100     FR 55   70              HART Raw = 8 SS = 89 23rd percentile Raw = 9 SS = 90 25th percentile            ROCFT Raw T-Score Percentile Raw T-Score Percentile   Copy 31 45 31 29 42 21   Time 261 37 10 237 42 21            BFRT - - - Raw = 20 T = 37 10th percentile            Digit Span Raw T-Score Percentile Raw T-Score Percentile   Forward 7 55 69 5 39 14   Backward 6 57 76 4 42 21   RDS 7 - - 9 - -            BTA Raw T-Score Percentile Raw T-Score Percentile   Number 6 39 14 7 44 27   Letter 6 39 14 8 49 46   Total 12 35 7 15 44 27            [place]  Raw T-Score Percentile Raw T-Score Percentile   Letter 26 43 24 24 45 31   Pattern 41 52 58 38 54 66   Total 67 52 58 62 49 46            TMT Raw T-Score Percentile Raw T-Score Percentile   A 23 55 69 23 57 76   B 66 53 62 61 55 69            HVLT - R Raw T-Score Percentile Raw T-Score Percentile   Total Recall 28 56 73 25 50 50   Delayed Recall 9 48 42 10 53 62   Percent Retention 90 48 42 111 67 96   Rec Discrim Index 11 53 62 10 46 34            WMS-IV Raw T-Score Percentile Raw T-Score Percentile   Logical Memory I 27 11 63 29 12 75   Logical Memory II 25 12 75 27 12 75   Recognition 24 - 26-50 25 - 51-75  BVMT-R Raw T-Score Percentile Raw T-Score Percentile   Total Recall 31 66 95 28 59 82   Delayed Recall 12 68 96 12 69 97   Percent Retention 100 54 66 100 54 66   Rec Discrim Index 6 54 66 6 55 69            BNT - 30 item Raw = 27 T = 47 38th percentile Raw = 30 T = 58 79th percentile            CIFA Raw T-Score Percentile Raw T-Score Percentile   Letter Word 35 58 79 30 55 69   Category Word 56 59 82 53 55 69            Modified WCST Raw T-Score Percentile Raw T-Score Percentile   Categories Correct 6 56 73 6 56 73   Perseverative Errors 1 50 50 1 51 54   Total Errors 2 62 88 10 43 24   % Perseverative 50 38 12 10 54 66            BDI-2 Raw = 18  Mild Raw = 21  Moderate            STAI Raw  Percentile Raw  Percentile   State 45  93 56  99   Trait 48  97 47  97            ESS Raw = 7  High Normal Raw = 5  Normal

## 2021-01-23 ENCOUNTER — Encounter: Admit: 2021-01-23 | Discharge: 2021-01-23 | Payer: BC Managed Care – HMO

## 2021-01-26 ENCOUNTER — Encounter: Admit: 2021-01-26 | Discharge: 2021-01-26 | Payer: BC Managed Care – HMO

## 2021-03-20 ENCOUNTER — Encounter: Admit: 2021-03-20 | Discharge: 2021-03-20 | Payer: BC Managed Care – HMO

## 2021-03-20 DIAGNOSIS — M5412 Radiculopathy, cervical region: Secondary | ICD-10-CM

## 2021-03-20 MED ORDER — PREGABALIN 25 MG PO CAP
25 mg | ORAL_CAPSULE | Freq: Three times a day (TID) | ORAL | 5 refills | Status: AC
Start: 2021-03-20 — End: ?

## 2021-03-25 ENCOUNTER — Encounter: Admit: 2021-03-25 | Discharge: 2021-03-25 | Payer: BC Managed Care – HMO

## 2021-04-02 ENCOUNTER — Encounter: Admit: 2021-04-02 | Discharge: 2021-04-02 | Payer: BC Managed Care – HMO

## 2021-04-02 MED ORDER — ALBUTEROL SULFATE 90 MCG/ACTUATION IN HFAA
Freq: Four times a day (QID) | 1 refills | Status: AC | PRN
Start: 2021-04-02 — End: ?

## 2021-04-02 NOTE — Telephone Encounter
Received e-request from [facility] requesting new Rx for Proair HFA.      Last Rx written:     Disp Refills Start End    albuterol sulfate (PROAIR HFA) 90 mcg/actuation HFA aerosol inhaler 25.5 g 1 10/27/2020     Sig: INHALE 1-2 PUFFS BY MOUTH EVERY 6 HOURS AS NEEDED    Sent to pharmacy as: albuterol sulfate HFA 90 mcg/actuation aerosol inhaler (PROAIR HFA)    Notes to Pharmacy: This prescription was filled on 08/21/2020. Any refills authorized will be placed on file.    E-Prescribing Status: Receipt confirmed by pharmacy (10/27/2020 9:11 AM CDT)              Next Appt due:    unspecified        No future appointments.        All protocol criteria met? No      Manual message review      Routed to Provider

## 2021-06-01 ENCOUNTER — Encounter: Admit: 2021-06-01 | Discharge: 2021-06-01 | Payer: BC Managed Care – HMO

## 2021-08-19 ENCOUNTER — Encounter: Admit: 2021-08-19 | Discharge: 2021-08-19 | Payer: BC Managed Care – HMO

## 2021-08-19 MED ORDER — DEPO-ESTRADIOL 5 MG/ML IM OIL
0 refills
Start: 2021-08-19 — End: ?

## 2021-09-09 ENCOUNTER — Encounter: Admit: 2021-09-09 | Discharge: 2021-09-09 | Payer: BC Managed Care – HMO

## 2021-09-09 DIAGNOSIS — M5412 Radiculopathy, cervical region: Secondary | ICD-10-CM

## 2021-09-09 MED ORDER — PREGABALIN 25 MG PO CAP
25 mg | ORAL_CAPSULE | Freq: Three times a day (TID) | ORAL | 5 refills
Start: 2021-09-09 — End: ?

## 2021-10-14 ENCOUNTER — Encounter: Admit: 2021-10-14 | Discharge: 2021-10-14 | Payer: BC Managed Care – HMO

## 2021-10-30 ENCOUNTER — Encounter: Admit: 2021-10-30 | Discharge: 2021-10-30 | Payer: BC Managed Care – HMO

## 2021-10-30 MED ORDER — ALBUTEROL SULFATE 90 MCG/ACTUATION IN HFAA
1 refills
Start: 2021-10-30 — End: ?

## 2021-10-30 NOTE — Telephone Encounter
Requested Prescriptions   Pending Prescriptions Disp Refills    albuterol sulfate (PROAIR HFA) 90 mcg/actuation HFA aerosol inhaler [Pharmacy Med Name: albuterol sulfate HFA 90 mcg/actuation aerosol inhaler] 25.5 g 1     Sig: INHALE 1-2 PUFFS BY MOUTH EVERY 6 HOURS AS NEEDED       Pulmonology: [facility] - [facility] Failed - 10/30/2021  8:04 AM        Failed - Valid encounter within last 12 months     Recent Visits  Date Type Provider Dept   06/17/20 Office Visit Telehealth [place], Levell July R, DO [facility]   03/19/20 Office Visit Schar[place], Levell July R, DO [facility]   Showing recent visits within past 720 days and meeting all other requirements  Future Appointments  No visits were found meeting these conditions.  Showing future appointments within next 360 days and meeting all other requirements            Failed - Manual Review: Review for uncontrolled symptoms if patient with asthma requests more than 1 inhaler every 3 months            Lind Covert, RN

## 2021-11-12 ENCOUNTER — Encounter: Admit: 2021-11-12 | Discharge: 2021-11-12 | Payer: BC Managed Care – HMO

## 2021-11-12 DIAGNOSIS — M5412 Radiculopathy, cervical region: Secondary | ICD-10-CM

## 2021-11-12 MED ORDER — PREGABALIN 25 MG PO CAP
25 mg | ORAL_CAPSULE | Freq: Three times a day (TID) | ORAL | 1 refills
Start: 2021-11-12 — End: ?

## 2021-11-25 ENCOUNTER — Encounter: Admit: 2021-11-25 | Discharge: 2021-11-25 | Payer: BC Managed Care – HMO

## 2021-12-10 ENCOUNTER — Encounter: Admit: 2021-12-10 | Discharge: 2021-12-10 | Payer: BC Managed Care – HMO

## 2022-01-11 ENCOUNTER — Encounter: Admit: 2022-01-11 | Discharge: 2022-01-11 | Payer: BC Managed Care – HMO

## 2022-02-24 ENCOUNTER — Encounter: Admit: 2022-02-24 | Discharge: 2022-02-24 | Payer: BC Managed Care – HMO

## 2022-02-24 MED ORDER — ALBUTEROL SULFATE 90 MCG/ACTUATION IN HFAA
1 refills
Start: 2022-02-24 — End: ?

## 2022-02-24 NOTE — Telephone Encounter
Requested Prescriptions   Pending Prescriptions Disp Refills    albuterol sulfate (PROAIR HFA) 90 mcg/actuation HFA aerosol inhaler [Pharmacy Med Name: albuterol sulfate HFA 90 mcg/actuation aerosol inhaler] 25.5 g 1     Sig: INHALE 1-2 PUFFS BY MOUTH EVERY 6 HOURS AS NEEDED       Pulmonology: Beta Agonists - SABA Failed - 02/24/2022  3:49 PM        Failed - Valid encounter within last 12 months     Recent Visits  Date Type Provider Dept   06/17/20 Office Visit Telehealth [place], Levell July R, DO [facility]   03/19/20 Office Visit [place], Levell July R, DO [facility]   Showing recent visits within past 720 days and meeting all other requirements  Future Appointments  No visits were found meeting these conditions.  Showing future appointments within next 360 days and meeting all other requirements            Failed - Manual Review: Review for uncontrolled symptoms if patient with asthma requests more than 1 inhaler every 3 months               Ashley Gaines

## 2022-04-19 ENCOUNTER — Encounter: Admit: 2022-04-19 | Discharge: 2022-04-19 | Payer: BC Managed Care – HMO

## 2022-06-04 ENCOUNTER — Encounter: Admit: 2022-06-04 | Discharge: 2022-06-04 | Payer: BC Managed Care – HMO

## 2022-07-08 ENCOUNTER — Encounter: Admit: 2022-07-08 | Discharge: 2022-07-08 | Payer: BC Managed Care – HMO

## 2022-07-08 MED ORDER — ALBUTEROL SULFATE 90 MCG/ACTUATION IN HFAA
1 refills
Start: 2022-07-08 — End: ?

## 2022-07-30 ENCOUNTER — Encounter: Admit: 2022-07-30 | Discharge: 2022-07-30 | Payer: BC Managed Care – HMO

## 2022-07-30 MED ORDER — ALBUTEROL SULFATE 90 MCG/ACTUATION IN HFAA
1 refills
Start: 2022-07-30 — End: ?

## 2022-08-31 ENCOUNTER — Encounter: Admit: 2022-08-31 | Discharge: 2022-08-31 | Payer: BC Managed Care – HMO

## 2022-08-31 MED ORDER — ALBUTEROL SULFATE 90 MCG/ACTUATION IN HFAA
1 refills
Start: 2022-08-31 — End: ?

## 2022-10-28 ENCOUNTER — Encounter: Admit: 2022-10-28 | Discharge: 2022-10-28 | Payer: BC Managed Care – HMO

## 2022-10-29 ENCOUNTER — Encounter: Admit: 2022-10-29 | Discharge: 2022-10-29 | Payer: BC Managed Care – HMO

## 2022-10-29 MED ORDER — ALBUTEROL SULFATE 90 MCG/ACTUATION IN HFAA
1 refills
Start: 2022-10-29 — End: ?

## 2023-01-24 ENCOUNTER — Encounter: Admit: 2023-01-24 | Discharge: 2023-01-25

## 2023-02-15 ENCOUNTER — Encounter: Admit: 2023-02-15 | Discharge: 2023-02-15

## 2023-02-22 ENCOUNTER — Encounter: Admit: 2023-02-22 | Discharge: 2023-02-22

## 2023-02-23 ENCOUNTER — Encounter: Admit: 2023-02-23 | Discharge: 2023-02-23

## 2023-02-25 ENCOUNTER — Encounter: Admit: 2023-02-25 | Discharge: 2023-02-25 | Payer: BC Managed Care – HMO

## 2023-02-25 ENCOUNTER — Encounter: Admit: 2023-02-25 | Discharge: 2023-02-25

## 2023-02-25 NOTE — Telephone Encounter
Pt lvm asking if we have received records prior to appt with Dr. Lisette Grinder.  Returned call. Pt states she also had MRI L-spine 05/2022 at [facility].  Images requested thru [facility].

## 2023-03-02 ENCOUNTER — Encounter: Admit: 2023-03-02 | Discharge: 2023-03-02 | Payer: BC Managed Care – HMO

## 2023-03-02 DIAGNOSIS — M545 Lumbar pain: Secondary | ICD-10-CM

## 2023-03-21 ENCOUNTER — Encounter: Admit: 2023-03-21 | Discharge: 2023-03-21

## 2023-03-22 ENCOUNTER — Encounter: Admit: 2023-03-22 | Discharge: 2023-03-22

## 2023-03-24 ENCOUNTER — Encounter: Admit: 2023-03-24 | Discharge: 2023-03-24

## 2023-04-01 NOTE — Patient Instructions
It was a pleasure seeing you in clinic today.  Please don't hesitate to call or send a MyChart message if you have any questions.     DO NOT call or send a MyChart message asking for any test or imaging (MRI, CT, etc.) results.  All results will be discussed with you at your follow-up appointment.     Laverle Patter RN, BSN  Clinical Nurse Coordinator  Dr. Greer Pickerel  Alexsis Johnson APRN-NP   Coralie Common PA  Hoven Darland PA  Sue Lush PA  [facility]  [facility]  [address]  [place], [place] [address]  [email or web address]  Phone: [phone]  Fax:  [phone]  Scheduling [phone]

## 2023-04-05 ENCOUNTER — Encounter: Admit: 2023-04-05 | Discharge: 2023-04-05

## 2023-04-12 ENCOUNTER — Encounter: Admit: 2023-04-12 | Discharge: 2023-04-12

## 2023-04-12 ENCOUNTER — Ambulatory Visit: Admit: 2023-04-12 | Discharge: 2023-04-13

## 2023-04-12 ENCOUNTER — Ambulatory Visit: Admit: 2023-04-12 | Discharge: 2023-04-12 | Payer: No Typology Code available for payment source

## 2023-04-12 DIAGNOSIS — M48061 Spinal stenosis, lumbar region without neurogenic claudication: Secondary | ICD-10-CM

## 2023-04-12 DIAGNOSIS — M51362 Degeneration of intervertebral disc of lumbar region with discogenic back pain and lower extremity pain: Secondary | ICD-10-CM

## 2023-04-12 DIAGNOSIS — Z981 Arthrodesis status: Secondary | ICD-10-CM

## 2023-04-12 DIAGNOSIS — M5030 Other cervical disc degeneration, unspecified cervical region: Secondary | ICD-10-CM

## 2023-04-12 DIAGNOSIS — M5440 Lumbago with sciatica, unspecified side: Secondary | ICD-10-CM

## 2023-04-12 MED ORDER — MELOXICAM 15 MG PO TAB
15 mg | ORAL_TABLET | Freq: Every day | ORAL | 0 refills | 30.00000 days | Status: AC
Start: 2023-04-12 — End: ?

## 2023-04-12 NOTE — Progress Notes
Marc A. Clydene Pugh, MD [facility]  Spinal Surgery Consultation      CHIEF COMPLAINT     Chief Complaint   Patient presents with    Lower Back - Pain    Middle Back - Pain    Left Hip - Pain    Right Hip - Pain    Right Leg - Pain, Numbness, Weakness    Left Leg - Pain, Weakness, Numbness       HISTORY OF PRESENT ILLNESS      Ashley Gaines is a 58 y.o. female.  She presents today for further evaluation of low back pain, middle back pain, left arm pain, bilateral hip pain, and bilateral leg pain.  Choosing between her middle back pain, low back pain, and pain down the arm, she would say her low back is worse.  If she had to choose between her low back and hips and legs, she is not able to choose which one is worse.  Laying down at night with her feet elevated her pain is better.  When she walks she has more pain in her back.  With a bowel movement of any kind she has a lot of pain in her low back.  Wearing a bra today, she has more middle back pain today.  She has a history of previous C6-7 ACDF with Dr. Stevan Born in 2019.  She reports that immediately after she woke up from surgery, she had pain across the top of her left shoulder that has persisted to this day and is now going down the left arm.  The arm at times feels cold.  She has pain into her hips.  She notes she had her IT bands released last year.  She says this helped tremendously but did not fix the problem.  She says her ankles would lock up and calves had charley horses for hours on end at night.  She has pain on the inside of her thighs into her calves.  She says 90% of the time the pain is worse on her right side.  She has tried injections in the past.  Last injection was September or October 2024.  In the records that we have in our system, she had trigger point injections in the thoracic spine.  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    NSAIDS:  Yes  PT:  Yes  Pain medications:  Yes  Chiropractic:  Yes  Activity modification: Yes  Injections:  Yes    Previous Spine Surgery: Yes - 2019      SPINE COMPREHENSIVE HISTORY FORM    Pain:  Where is your worst symptom?: (Patient-Rptd) Leg(s)  Words that best describe the quality of your symptom:: (Patient-Rptd) Aching; Burning; Throbbing; Unbearable  Are you having any pain?: (Patient-Rptd) Yes  When did your pain start?: (Patient-Rptd) Greater than 1 year  Have you been given a diagnosis for your pain?: (Patient-Rptd) Yes  Explanation of diagnosis:: (Patient-Rptd) Joint compression of the spine.  Is the pain constant or does it come and go?: (Patient-Rptd) Constant  Does the pain move into your arm or leg?: (Patient-Rptd) Yes  Explanation of how far pain moves:: (Patient-Rptd) Down to my ankles and almost to my wriste on my left side.  Is this work related?: (Patient-Rptd) No  Was there a cause of injury (eg. fall, MVA, heavy lifting)?: (Patient-Rptd) No  What makes the symptom better? (eg. rest, sitting down, ice, heat, medications):: (Patient-Rptd) Heat; Medications; Other (comment)  Other reason that makes the pain better?: (Patient-Rptd) Sit in the hot bath for a couple hours.  What makes the symptom worse? (eg. sit, stand, walk, bend):: (Patient-Rptd) Sit; Stand; Walk  Do you have numbness in your arm or leg?: (Patient-Rptd) None  Do you have weakness in your arm or leg?: (Patient-Rptd) None  Have you lost control of bladder or bowel function?: (Patient-Rptd) No  Is the pain worse at night?: (Patient-Rptd) Yes  Description of how pain effects sleep:: (Patient-Rptd) Pain worse with lying on your back; Pain wakes you up at night  Have you had any unplanned weight loss?: (Patient-Rptd) No      Pain Scale:  If you are here for pain. Where is the pain?: (Patient-Rptd) Low back; Middle back; Neck or upper back  On a scale of 0 to 10, how bad is your low back pain?: (Patient-Rptd) 8  On a scale of 0 to 10, what was your average low back pain level in the past week?: (Patient-Rptd) 8  On a scale of 0 to 10, how bad is your middle back pain?: (Patient-Rptd) 8  On a scale of 0 to 10, what was your average middle back pain level in the past week?: (Patient-Rptd) 8  On a scale of 0 to 10, how bad is your neck or upper back pain?: (Patient-Rptd) 6  On a scale of 0 to 10, what was your average neck or upper back pain level in the past week?: (Patient-Rptd) 6           Tests for this condition:  Have you had any of the following tests for this condition?: (Patient-Rptd) MRI  What was the approximate date of MRI?: (Patient-Rptd) 12/14/22  List the facility where MRI took place:: (Patient-Rptd) [facility]      Treatment(s) tried for this condition:  What conservative therapies have you done for this condition?: (Patient-Rptd) Chiropractic; Injection  What ws the approximate date chiropractic care was started?: (Patient-Rptd) 08/14/22  How many times did you see the chiropractor?: (Patient-Rptd) 1  What was the approximate date of your last injection?: (Patient-Rptd) 12/14/22  What type of spine injection have you had in the past?: (Patient-Rptd) Burning of nerve; Epidural steroid injection; Trigger point  Have you had surgery relevant to pain?: (Patient-Rptd) No      Medications  Are you currently or in the past taken the following prescribed medications and found them effective in controlling your symptoms?: (Patient-Rptd) Other (Please explain)  What other medications have you taken that are effective in controlling your symptoms?: (Patient-Rptd) Tramadol kinda takes the edge off a little. 100mg  helps for about 1 hour.      Blood Thinner  Are you taking one of the following blood thinners?: (Patient-Rptd) No                   PAST MEDICAL HISTORY     Past Medical History:    ADHD (attention deficit hyperactivity disorder)    Asthma    Cognitive impairment    Degenerative disc disease, cervical    Degenerative disc disease, lumbar    Degenerative disc disease, thoracic    Difficult intubation Diverticulitis of colon (without mention of hemorrhage)(562.11)    Dyshidrotic eczema    Fatty liver    Fibromyalgia    Hypoxia    IBS (irritable bowel syndrome)    Kidney stones    Migraines    Motion sickness    Muscle disease or syndrome  Sleep apnea    Spinal stenosis       PAST SURGICAL HISTORY     Surgical History:   Procedure Laterality Date    APPENDECTOMY  1983    HX HYSTERECTOMY  1984    CHOLECYSTECTOMY  2002    CARPAL TUNNEL RELEASE Bilateral 2004    HAND SURGERY Left 2004    thumb    LYSIS OF ADHESIONS  2011    abdominal adhesiolysis surgery    KNEE SURGERY Left 2014    meniscus repair    KIDNEY STONE SURGERY  05/2017    7mm stone removed    RIGHT ACHILLES TAKEDOWN AND REPAIR Right 05/18/2017    Performed by Tanja Port, MD at [facility]    REPAIR ACHILLES TENDON Left 12/22/2017    Performed by Tanja Port, MD at [facility]    PLANTAR FASCIA RELEASE Left 12/22/2017    Performed by Tanja Port, MD at [facility]    FUSION SPINE ANTERIOR WITH DISCECTOMY/ DECOMPRESSION - CERVICAL 6-7 WITH ANTERIOR SPINAL INSTRUMENTATION Right 03/06/2018    Performed by Addison Lank, MD at [facility]    MICROSURGICAL TECHNIQUES - REQUIRING OPERATING MICROSCOPE USE N/A 03/06/2018    Performed by Addison Lank, MD at [facility]    COLONOSCOPY      GASTRIC BYPASS      12/2017    HYSTEROSCOPY      endometriosis    PR LAPAROSCOPY SURG RPR INITIAL INGUINAL HERNIA  2011    SHOULDER SURGERY Bilateral 2003, 2004       FAMILY HISTORY   family history includes Alcohol liver disease in her father; Cancer in her maternal aunt and maternal uncle; Coronary Artery Disease in her father; Diabetes in her father; Heart problem in her father; Other in her maternal grandfather; Transient Ischaemic Attack in her father.    SOCIAL HISTORY     Social History     Socioeconomic History    Marital status: Married   Tobacco Use    Smoking status: Never    Smokeless tobacco: Never   Substance and Sexual Activity    Alcohol use: Never    Drug use: Never Sexual activity: Yes     Partners: Male     Birth control/protection: Surgical       ALLERGIES     Allergies   Allergen Reactions    Adhesive Tape (Rosins) RASH    Bee [Venom-Honey Bee] EDEMA     carries epipen    Latex RASH    Succinylcholine MUSCLE PAIN    Benadryl [Diphenhydramine Hcl] SEE COMMENTS     restless leg syndrome    Chromium REDNESS    Codeine NAUSEA AND VOMITING    Hydrocodone ITCHING    Metoclopramide STOMACH UPSET    Morphine NAUSEA AND VOMITING    Oxycodone ITCHING     tolerates with antihistamine       MEDICATIONS     Current Outpatient Medications:     albuterol sulfate (PROAIR HFA) 90 mcg/actuation HFA aerosol inhaler, INHALE 1-2 PUFFS BY MOUTH EVERY 6 HOURS AS NEEDED, Disp: 25.5 g, Rfl: 1    amphetamine-dextroamphetamine XR (ADDERALL XR) 15 mg capsule, Take by mouth daily., Disp: , Rfl:     ascorbic acid (VITAMIN C) 500 mg tablet, Take 1 tablet by mouth twice daily., Disp: , Rfl:     baclofen (LIORESAL) 10 mg tablet, , Disp: , Rfl:     cholecalciferol (VITAMIN D-3) 1,000 units tablet, Take  1,000 Units by mouth daily., Disp: , Rfl:     Cyanocobalamin-Cobamamide (B12) 5,000-100 mcg lozg, Place  under tongue., Disp: , Rfl:     DETROL LA 2 mg capsule, , Disp: , Rfl:     docusate (COLACE) 100 mg capsule, Take one capsule by mouth twice daily as needed for Constipation., Disp: 60 capsule, Rfl: 0    duloxetine DR (CYMBALTA) 60 mg capsule, Take 60 mg by mouth twice daily., Disp: , Rfl:     famotidine (PEPCID) 20 mg tablet, Take 20 mg by mouth twice daily., Disp: , Rfl:     fexofenadine(+) (ALLEGRA) 180 mg tablet, Take 180 mg by mouth at bedtime daily., Disp: , Rfl:     fludrocortisone (FLORINEF) 0.1 mg tablet, Take one tablet by mouth daily., Disp: 90 tablet, Rfl: 2    hydrocortisone 2.5 % topical ointment, Apply  topically to affected area twice daily as needed., Disp: 30 g, Rfl: 1    MAGNESIUM PO, Take 1 tablet by mouth daily. 100mg , Disp: , Rfl:     meloxicam (MOBIC) 15 mg tablet, Take one tablet by mouth daily. TAKE FOR 14 DAYS FOR INFLAMMATION, THEN STOP. REPEAT PRN., Disp: 90 tablet, Rfl: 0    methocarbamoL (ROBAXIN) 750 mg tablet, Take one tablet by mouth three times daily as needed for Spasms., Disp: 90 tablet, Rfl: 2    omeprazole DR (PRILOSEC) 20 mg capsule, Take 20 mg by mouth twice daily., Disp: , Rfl:     other medication, Journey Bariastic Multivitamin 1 tablet by mouth twice daily, Disp: , Rfl:     Potassium 99 mg tab, Take  by mouth., Disp: , Rfl:     pregabalin (LYRICA) 25 mg capsule, TAKE ONE CAPSULE BY MOUTH THREE TIMES DAILY, Disp: 90 capsule, Rfl: 1    PROZAC 20 mg capsule, , Disp: , Rfl:     SUMAtriptan succinate (IMITREX) 100 mg tablet, Take one tablet by mouth once as needed., Disp: , Rfl:     traMADoL (ULTRAM) 50 mg tablet, Take one tablet by mouth every 6 hours as needed for Pain., Disp: , Rfl:     traZODone (DESYREL) 50 mg tablet, Take  by mouth., Disp: , Rfl:     trazodone HCl (DESYREL PO), , Disp: , Rfl:     ultrasonic cleaner,holding sol (POWER CLEANER MISC), Use 400 mg as directed., Disp: , Rfl:     zinc sulfate 220 mg (50 mg elemental zinc) capsule, Take 220 mg by mouth daily., Disp: , Rfl:     REVIEW OF SYSTEMS   Review of Systems   Constitutional:  Positive for activity change and fatigue. Negative for appetite change, chills, diaphoresis, fever and unexpected weight change.   HENT:  Positive for rhinorrhea. Negative for congestion, dental problem, drooling, ear discharge, ear pain, facial swelling, hearing loss, mouth sores, nosebleeds, postnasal drip, sinus pressure, sinus pain, sneezing, sore throat, tinnitus, trouble swallowing and voice change.    Eyes:  Positive for discharge and redness. Negative for photophobia, pain, itching and visual disturbance.   Respiratory:  Positive for apnea and shortness of breath. Negative for cough, choking, chest tightness, wheezing and stridor.    Cardiovascular:  Negative for chest pain, palpitations and leg swelling.   Endocrine: Positive for cold intolerance and heat intolerance. Negative for polydipsia, polyphagia and polyuria.   Genitourinary:  Positive for difficulty urinating, flank pain and frequency. Negative for decreased urine volume, dyspareunia, dysuria, enuresis, genital sores, hematuria, menstrual problem, pelvic pain, urgency, vaginal bleeding,  vaginal discharge and vaginal pain.   Musculoskeletal:  Positive for arthralgias, back pain, gait problem, myalgias, neck pain and neck stiffness. Negative for joint swelling.   Skin:  Negative for color change, pallor, rash and wound.   Allergic/Immunologic: Positive for environmental allergies, food allergies and immunocompromised state.   Neurological:  Positive for dizziness, syncope, weakness, numbness and headaches. Negative for tremors, seizures, facial asymmetry, speech difficulty and light-headedness.   Hematological:  Negative for adenopathy. Does not bruise/bleed easily.   Psychiatric/Behavioral:  Positive for confusion, decreased concentration and sleep disturbance. Negative for agitation, behavioral problems, dysphoric mood, hallucinations and suicidal ideas. The patient is not hyperactive.    All other systems reviewed and are negative.    A 10-point ROS was otherwise negative.  PHYSICAL EXAM   Blood pressure 126/55, pulse 107, temperature 36.7 ?C (98 ?F), temperature source Temporal, height 158.8 cm (5' 2.5), weight 75.9 kg (167 lb 6.4 oz), last menstrual period 08/26/1982, SpO2 97%.  Body mass index is 30.13 kg/m?Marland Kitchen    Oswestry Total Score:: (Patient-Rptd) 48  Pain Score: Seven    Constitutional: Alert, NAD  Psychiatric: Mood and affect appropriate  Eyes: EOMI  Respiratory: Unlabored respirations  Cardiovascular: Palpable radial and pedal pulses distally.  Skin: No rashes or lesions  Musculoskeletal:  Spine Exam:    Gait: Ambulates with a normal gait. Able to heel and toe walk without difficulty.   Stance: Balanced in the coronal and sagittal planes.     NECK:  Anterior: No or lesions.  Right side transverse incision anterior neck.  Posterior: No scars or lesions    PALPATION:  No tenderness to palpation in the midline or paraspinals.    MOTOR:  Upper Ext. Deltoid Triceps Biceps Wrist Extensors Wrist Flexors Grip Intrinsics   Right 5 5 5 5 5 5 5    Left 5 5 5 5 5 5 5      Lower Ext. Hip Flex Quads Hamstrings Gastroc soleus Tibialis anterior EHL   Right 5 5 5 5 5 5    Left 5 5 5 5 5 5      SENSATION:  Upper extremity: Sensation intact to light touch in C5-T1 distributions  Lower extremity: Sensation intact to light touch in L3-S1 distributions    REFLEXES:   Biceps Brachiorad Patellar Achilles   Right mute mute 2+ 2+   Left mute mute 2+ 2+     -Negative Hoffman's bilaterally  -No clonus      RADIOGRAPHIC EVALUATION     MRI thoracic spine shows no abnormalities.  No cord compression.  No significant degeneration.    MRI lumbar spine from June of 2024 shows foraminal stenosis on the right at L4-5 is the most significant finding.  Otherwise centrally no significant stenosis in my opinion.      SCOLIOSIS EOS AP/LAT  Narrative: Exam: SCOLIOSIS EOS AP/LAT    CLINICAL INDICATION: 57 years. Low back pain.    COMPARISON: 01/24/2023, MRI T-SPINE EXTERNAL IMAGING 08/23/2022, MRI L-SPINE EXTERNAL IMAGING 07/23/2020, C SPINE 4 OR 5 VIEWS 06/10/2020, L SPINE AP & LATERAL.  Impression: 1.  12 thoracic and 5 nonrib-bearing lumbar vertebral segments. Slight left thoracic curvature.    2.  ACDF with interbody fusion at C6-7. Large bridging anterior osteophyte at C5-6 is again noted.    3.  Moderate degenerative disc disease in the mid and lower thoracic spine.    4.  Severe disc disease at L4-5 has mildly progressed since prior plain film.    5.  Mild/moderate  right and mild left hip degenerative changes.    6.  Right upper quadrant clips.     Finalized by Shanna Cisco, M.D. on 04/12/2023 1:21 PM. Dictated by Shanna Cisco, M.D. on 04/12/2023 1:17 PM.          ASSESSMENT / PLAN     Ashley Gaines is a 58 y.o. female with:     1. Chronic midline low back pain with sciatica, sciatica laterality unspecified  AMB REFERRAL TO PHYSICAL OR OCCUPATIONAL THERAPY    MRI C-SPINE WO CONTRAST      2. Degeneration of intervertebral disc of lumbar region with discogenic back pain and lower extremity pain  AMB REFERRAL TO PHYSICAL OR OCCUPATIONAL THERAPY    MRI C-SPINE WO CONTRAST      3. DDD (degenerative disc disease), cervical  AMB REFERRAL TO PHYSICAL OR OCCUPATIONAL THERAPY    MRI C-SPINE WO CONTRAST      4. H/O spinal fusion  AMB REFERRAL TO PHYSICAL OR OCCUPATIONAL THERAPY    MRI C-SPINE WO CONTRAST      5. Foraminal stenosis of lumbar region  AMB REFERRAL TO PHYSICAL OR OCCUPATIONAL THERAPY    MRI C-SPINE WO CONTRAST          Middle back and low back pain:  I don't have a surgery for her middle back pain.  I think this is largely muscular related.  I would recommend icing several times a day 15 minutes at a time, avoiding heat as heat is pro-inflammatory.  I prescribed the patient Meloxicam that they should take regularly for the next 14 days.  We discussed the dosing of this as well as avoiding other NSAIDs at that time such as Ibuprofen and Aleve.  After two weeks of icing and anti-inflammatories, I would have her get started in formal physical therapy.  Strengthening the back/neck muscles is important for long term success in minimizing pain after reducing inflammation.    Hip and leg pain:  She is going to work on getting [place] the records from her previous injections.  I think if she has not had a transforaminal epidural steroid injection on the right at L4-5, then I would recommend getting into one of our anesthesia pain providers to do that.  That would not only be potentially therapeutic, but diagnostic in nature.    Left arm pain:  If she wanted to look into her left arm pain, I would get an MRI of her cervical spine.  We will order that and follow up with her after that is completed.         In the presence of Ashley Deist, MD , I have taken down these notes, Ashley Gaines, Scribe. April 12, 2023 1:48 PM    Apolinar Junes B. Lisette Grinder, MD, MPH  Spinal Surgery  Ashley Gaines. Clydene Pugh, MD [facility]  Nurse: Ashley Gaines, BSN, RN, CNOR   [phone]  -  [email or web address]

## 2023-04-19 ENCOUNTER — Encounter: Admit: 2023-04-19 | Discharge: 2023-04-19 | Payer: No Typology Code available for payment source

## 2023-04-19 NOTE — Telephone Encounter
Received vm from [facility], Dr. Scheryl Darter office requesting clinic note from 1/28 be faxed to their office.  Note faxed to [phone] as requested.

## 2023-04-21 ENCOUNTER — Encounter: Admit: 2023-04-21 | Discharge: 2023-04-21

## 2023-04-29 ENCOUNTER — Encounter: Admit: 2023-04-29 | Discharge: 2023-04-29

## 2023-05-02 ENCOUNTER — Encounter: Admit: 2023-05-02 | Discharge: 2023-05-02

## 2023-05-02 ENCOUNTER — Ambulatory Visit: Admit: 2023-05-02 | Discharge: 2023-05-03

## 2023-05-03 ENCOUNTER — Encounter: Admit: 2023-05-03 | Discharge: 2023-05-03

## 2023-05-13 ENCOUNTER — Encounter: Admit: 2023-05-13 | Discharge: 2023-05-13

## 2023-05-20 ENCOUNTER — Encounter: Admit: 2023-05-20 | Discharge: 2023-05-20

## 2023-05-20 ENCOUNTER — Ambulatory Visit: Admit: 2023-05-20 | Discharge: 2023-05-21

## 2023-05-20 ENCOUNTER — Ambulatory Visit: Admit: 2023-05-20 | Discharge: 2023-05-20

## 2023-05-20 DIAGNOSIS — M4722 Other spondylosis with radiculopathy, cervical region: Secondary | ICD-10-CM

## 2023-05-20 DIAGNOSIS — M5412 Radiculopathy, cervical region: Secondary | ICD-10-CM

## 2023-05-20 DIAGNOSIS — M4802 Spinal stenosis, cervical region: Secondary | ICD-10-CM

## 2023-05-20 MED ORDER — DEXAMETHASONE SODIUM PHOS (PF) 10 MG/ML IJ EPIDURAL SOLN
10 mg | Freq: Once | EPIDURAL | 0 refills | Status: AC
Start: 2023-05-20 — End: ?

## 2023-05-20 MED ORDER — SODIUM CHLORIDE 0.9 % IJ SOLN
2 mL | Freq: Once | INTRAMUSCULAR | 0 refills | Status: AC
Start: 2023-05-20 — End: ?

## 2023-05-20 MED ORDER — IOHEXOL 300 MG IODINE/ML IV SOLN
1 mL | Freq: Once | 0 refills | Status: AC
Start: 2023-05-20 — End: ?

## 2023-05-20 MED ORDER — LIDOCAINE (PF) 10 MG/ML (1 %) IJ SOLN
2 mL | Freq: Once | INTRAMUSCULAR | 0 refills | Status: AC
Start: 2023-05-20 — End: ?

## 2023-05-20 NOTE — Discharge Instructions - Supplementary Instructions
 GENERAL POST PROCEDURE INSTRUCTIONS  Physician: _________________________________  Procedure Completed Today:  Joint Injection (hip, knee, shoulder)  Cervical Epidural Steroid Injection  Cervical Transforaminal Steroid Injection  Trigger Point Injection  Caudal Epidural Steroid Injection  Piriformis Injection  Pudendal Nerve Block  Other _____________________ Thoracic Epidural Steroid Injection  Lumbar Epidural Steroid Injection  Lumbar Transforaminal Steroid Injection  Facet Joint Injection  Celiac Nerve Block  Sacrococcygeal  Sacroiliac Joint Injection   Important information following your procedure today:  You may drive today     If you had sedation, you may NOT drive today  Rest at home for the next 6 hours.  You may then begin to resume your normal activities.  DO NOT drive any vehicle, operate any power tools, drink alcohol, make any major decisions, or sign any legal documents for the next 12 hours.  Pain relief may not be immediate. It is possible you may even experience an increase in pain during the first 24-48 hours followed by a gradual decrease of your pain.  Though the procedure is generally safe, and complications are rare, we do ask that you be aware of any of the following:  Any swelling, persistent redness, new bleeding or drainage from the site of the injection.  You should not experience a severe headache.  You should not run a fever over 101oF.  New onset of sharp, severe back and or neck pain.  New onset of upper or lower extremity numbness or weakness.  New difficulty controlling bowel or bladder function after injection.  New shortness of breath.  ** If any of these occur, please call to report this occurrence to the nurse of Dr. Evelena Leyden at [phone]. If you are calling after 4:00 p.m. or on weekends or holidays, please call [phone] and ask to have the resident physician on call for the physician paged or go to your local emergency room.  You may experience soreness at the injection site. Ice can be applied at 20-minute intervals for the first 24 hours. The following day you may alternate ice with heat if you are experiencing muscle tightness, otherwise continue with ice. Ice works best at decreasing pain. Avoid application of direct heat, hot showers or hot tubs today.  Avoid strenuous activity today. You many resume your regular activities and exercise tomorrow.  Patients with diabetes may see an elevation in blood sugars for 7-10 days after the injection. It is important to pay close attention to your diet, check your blood sugars daily and report extreme elevations to the physician that manages your diabetes.  Patients taking daily blood thinners can resume their regular dose this evening.  It is important that you take all medications ordered by your pain physician. Taking medications as ordered is an important part of your pain care plan. If you cannot continue the medication plan, please notify the physician.    Possible side effects to steroids that may occur:  Flushing or redness of the face  Irritability  Fluid retention  Change in women's menses  Minor headache    If you are unable to keep your upcoming appointment, please notify the [facility] Center scheduler at [phone] at least 24 hours in advance. If you have questions for the surgery center, call [facility] at [phone].

## 2023-05-20 NOTE — Procedures
 Attending Surgeon: Philomena Course, MD    Anesthesia: Local    Pre-Procedure Diagnosis:   1. Cervical radiculopathy    2. Cervical spondylosis with radiculopathy    3. Cervical stenosis of spine        Post-Procedure Diagnosis:   1. Cervical radiculopathy    2. Cervical spondylosis with radiculopathy    3. Cervical stenosis of spine        Selective Nerve Rootblock Cervical/Thoracic  Procedure: epidural - selective nerve root    Laterality: bilateral   on 05/20/2023 1:15 PM  Location: cervical - C5-6      Consent:   Consent obtained: written  Consent given by: patient  Risks discussed: allergic reaction, bleeding, infection, nerve damage, no change or worsening in pain and reaction to medication    Discussed with patient the purpose of the treatment/procedure, other ways of treating my condition, including no treatment/ procedure and the risks and benefits of the alternatives. Patient has decided to proceed with treatment/procedure.        Universal Protocol:  Relevant documents: relevant documents present and verified  Test results: test results available and properly labeled  Imaging studies: imaging studies available  Required items: required blood products, implants, devices, and special equipment available  Site marked: the operative site was marked  Patient identity confirmed: Patient identify confirmed verbally with patient.        Time out: Immediately prior to procedure a time out was called to verify the correct patient, procedure, equipment, support staff and site/side marked as required      Procedures Details:   Indications: pain   Prep: chlorhexidine  Patient position: prone  Estimated Blood Loss: minimal  Specimens: none  Number of Levels: 1  Approach: paramedian  Guidance: fluoroscopy  Contrast: Procedure confirmed with contrast under live fluoroscopy.  Needle and Epidural Catheter: quincke  Needle size: 25 G  Injection procedure: Negative aspiration for blood and Incremental injection  Patient tolerance: Patient tolerated the procedure well with no immediate complications. Pressure was applied, and hemostasis was accomplished.  Comments: 5mg  dexamethasone and 0.5 mL of PF NS injected surrounding the right C6 nerve root. Repeated on the left. Live contrast was used to confirm proper location.  This patient's clinical history, exam, AND imaging support radiculopathy AND there is a significant impact on quality of life and function AND the pain has been present for at least 4 weeks AND they have failed to improve with noninvasive conservative care.    This patient failed to have improvement of their pain with epidural injection. Given the severity of their pain, a repeat injection is being performed at least 14 days later with a different Approach, Level and Medication.    Estimated blood loss: none or minimal  Specimens: none  Patient tolerated the procedure well with no immediate complications. Pressure was applied, and hemostasis was accomplished.

## 2023-05-20 NOTE — Progress Notes
 SPINE [facility]ENTER  INTERVENTIONAL PAIN PROCEDURE HISTORY AND PHYSICAL    Chief Complaint: Pain    HISTORY OF PRESENT ILLNESS:    Referred by Dr. Lisette Grinder for bilateral C5-6 TFESI  Neck and BUE pain  Tried PT, medications    Past Medical History:    ADHD (attention deficit hyperactivity disorder)    Asthma    Cognitive impairment    Degenerative disc disease, cervical    Degenerative disc disease, lumbar    Degenerative disc disease, thoracic    Difficult intubation    Diverticulitis of colon (without mention of hemorrhage)(562.11)    Dyshidrotic eczema    Fatty liver    Fibromyalgia    Heart murmur    Hypoxia    IBS (irritable bowel syndrome)    Kidney stones    Migraines    Motion sickness    Muscle disease or syndrome    Nerve injury    Sleep apnea    Spinal stenosis       Surgical History:   Procedure Laterality Date    APPENDECTOMY  1983    HX HYSTERECTOMY  1984    CHOLECYSTECTOMY  2002    CARPAL TUNNEL RELEASE Bilateral 2004    HAND SURGERY Left 2004    thumb    LYSIS OF ADHESIONS  2011    abdominal adhesiolysis surgery    KNEE SURGERY Left 2014    meniscus repair    KIDNEY STONE SURGERY  05/2017    7mm stone removed    RIGHT ACHILLES TAKEDOWN AND REPAIR Right 05/18/2017    Performed by Tanja Port, MD at [facility]    REPAIR ACHILLES TENDON Left 12/22/2017    Performed by Tanja Port, MD at [facility]    PLANTAR FASCIA RELEASE Left 12/22/2017    Performed by Vopat, Lowry Ram, MD at [facility]    FUSION SPINE ANTERIOR WITH DISCECTOMY/ DECOMPRESSION - CERVICAL 6-7 WITH ANTERIOR SPINAL INSTRUMENTATION Right 03/06/2018    Performed by Addison Lank, MD at [facility]    MICROSURGICAL TECHNIQUES - REQUIRING OPERATING MICROSCOPE USE N/A 03/06/2018    Performed by Addison Lank, MD at [facility]    COLONOSCOPY      GASTRIC BYPASS      12/2017    HYSTEROSCOPY      endometriosis    PR LAPAROSCOPY SURG RPR INITIAL INGUINAL HERNIA  2011    SHOULDER SURGERY Bilateral 2003, 2004    SURGERY  Surgery twice on both shoulders. First in 2002 on tight side, last is left in september 2024.       family history includes Alcohol liver disease in her father; Cancer in her maternal aunt and maternal uncle; Coronary Artery Disease in her father; Diabetes in her father and mother; Heart problem in her father; Other in her maternal grandfather; Transient Ischaemic Attack in her father.    Social History     Socioeconomic History    Marital status: Married   Tobacco Use    Smoking status: Never    Smokeless tobacco: Never   Substance and Sexual Activity    Alcohol use: Never    Drug use: Never    Sexual activity: Yes     Partners: Male     Birth control/protection: Surgical       Allergies   Allergen Reactions    Adhesive Tape (Rosins) RASH    Bee [Venom-Honey Bee] EDEMA     carries epipen    Latex RASH  Succinylcholine MUSCLE PAIN    Benadryl [Diphenhydramine Hcl] SEE COMMENTS     restless leg syndrome    Chromium REDNESS    Codeine NAUSEA AND VOMITING    Hydrocodone ITCHING    Metoclopramide STOMACH UPSET    Morphine NAUSEA AND VOMITING    Oxycodone ITCHING     tolerates with antihistamine       Vitals:    05/20/23 1252   BP Source: Arm, Right Upper   Pulse: 87   Temp: 37.2 ?C (98.9 ?F)   SpO2: 98%   Weight: 72.6 kg (160 lb)   Height: 157.5 cm (5' 2)        Oswestry Total Score:: 56    REVIEW OF SYSTEMS: 10 point ROS obtained and negative except neck pain      PHYSICAL EXAM:  General: Alert, cooperative, no acute distress.  HEENT: Normocephalic, atraumatic.  Neck: Supple.  Lungs: Unlabored respirations, bilateral and equal chest excursion.  Heart: Regular rate.  Skin: Warm and dry to touch.  Abdomen: Nondistended.  MSK: No deformity.  Neurological: Alert and oriented x3.          IMPRESSION:    1. Cervical radiculopathy    2. Cervical spondylosis with radiculopathy    3. Cervical stenosis of spine         PLAN:   Bilat C5-6 TFESI

## 2023-06-01 ENCOUNTER — Encounter: Admit: 2023-06-01 | Discharge: 2023-06-01

## 2023-06-10 ENCOUNTER — Encounter: Admit: 2023-06-10 | Discharge: 2023-06-10

## 2023-06-13 NOTE — Patient Instructions
 It was a pleasure seeing you in clinic today.  Please don't hesitate to call or send a MyChart message if you have any questions.     DO NOT call or send a MyChart message asking for any test or imaging (MRI, CT, etc.) results.  All results will be discussed with you at your follow-up appointment.     Laverle Patter RN, BSN  Clinical Nurse Coordinator  Dr. Greer Pickerel  Alexsis Johnson APRN-NP   Coralie Common PA  Vaiden Darland PA  Sue Lush PA  [facility]  [facility] A[facility]  [address]  [place], [place] [address]  [email or web address]  Phone: [phone]  Fax:  [phone]  Scheduling [phone]

## 2023-06-14 ENCOUNTER — Encounter: Admit: 2023-06-14 | Discharge: 2023-06-14

## 2023-06-14 ENCOUNTER — Ambulatory Visit: Admit: 2023-06-14 | Discharge: 2023-06-15

## 2023-06-14 DIAGNOSIS — M4722 Other spondylosis with radiculopathy, cervical region: Secondary | ICD-10-CM

## 2023-06-14 DIAGNOSIS — M48061 Spinal stenosis, lumbar region without neurogenic claudication: Secondary | ICD-10-CM

## 2023-06-14 DIAGNOSIS — M5030 Other cervical disc degeneration, unspecified cervical region: Secondary | ICD-10-CM

## 2023-06-14 DIAGNOSIS — M48062 Spinal stenosis, lumbar region with neurogenic claudication: Secondary | ICD-10-CM

## 2023-06-14 DIAGNOSIS — M4802 Spinal stenosis, cervical region: Secondary | ICD-10-CM

## 2023-06-14 DIAGNOSIS — M5440 Lumbago with sciatica, unspecified side: Secondary | ICD-10-CM

## 2023-06-14 NOTE — Progress Notes
 [facility] Clydene Pugh, MD [facility]  Follow - Up Visit  Subjective     REASON FOR VISIT   Pain of the Lower Back; Pain of the Right Hip; Pain of the Left Hip; Pain, Numbness, and Weakness of the Right Leg; Pain, Weakness, and Numbness of the Left Leg; and Other (CT REVIEW)    SUBJECTIVE     Ms. Lerner returns for follow up after bilateral C5-6 transforaminals on 05/20/2023 with Dr. Evelena Leyden and to review CT L-spine findings.  She reports she did not get any real meaningful relief form the injection aside from day three she felt a little better.  She has neck pain with radiation into the left shoulder and down the arm.  She has low back pain radiating into her hips and down the legs.  The right leg is typically worse than the left.  She has difficulty sleeping due to the pain in her back and hip and legs.  If she had to choose whether she would want her neck or back addressed first, she would pick her back.    She has brought records with her today indicating the location of her previous injections.  She had L4-5 ILESI on the right on 12/17/2022.  10/25/2022 she had right L5-S1 ILESI.  On 09/17/2022 she had bilateral L4-5 ILESI.  She reports she got the most relief with the injection in July.         ROS: Review of Systems   Musculoskeletal:  Positive for arthralgias and back pain.   Neurological:  Positive for weakness and numbness.   All other systems reviewed and are negative.    A 10-point ROS was performed and negative.    PHYSICAL EXAM   Blood pressure 129/64, pulse 69, temperature 36.8 ?C (98.2 ?F), temperature source Temporal, height 158.8 cm (5' 2.5), weight 76.7 kg (169 lb 3.2 oz), last menstrual period 08/26/1982, SpO2 98%.  Body mass index is 30.45 kg/m?Marland Kitchen  Oswestry Total Score:: (Patient-Rptd) 52  Pain Score: Eight    Constitutional: Alert, NAD  Head: Atraumatic  Eyes: EOMI  Respiratory: Unlabored breathing  Cardiovascular: Regular rate  Skin: No rashes or open wounds appreciated on back  Musculoskeletal: Strength stable  Neurologic: Sensation stable    Unchanged.    RADIOGRAPHS       CT L-SPINE WO CONTRAST  Narrative: EXAM: CT L-SPINE     HISTORY: Lumbar spondylosis.    TECHNIQUE: Multiple contiguous axial images were obtained of the lumbar spine without intravenous contrast. Sagittal and coronal reformations were obtained.     Comparison: MRI 08/18/2020; scoliosis radiograph 04/12/2023; MRI L-spine 08/23/2022.     FINDINGS:     There is normal lumbar alignment. 5 lumbar type vertebral bodies with maintained vertebral body heights. No acute lumbar fracture or subluxation is identified. Lumbar spondylosis with mild multilevel degenerative disc disease, progressed of now marked degree at L4-5 with disc height loss and endplate sclerosis. Level by level discussion below. Mild degenerative changes of the SI joints. The paraspinous soft tissues are unremarkable. Colonic diverticulosis. Vascular calcifications. Surgical clips within the right upper quadrant.    T12-L1: No bony stenosis.    L1-2: Ligamentum flavum thickening-calcification and facet arthrosis contributes to likely mild bilateral foraminal stenosis (greater on the right). No spinal stenosis.    L2-3: No bony stenosis.    L3-4: Partly calcified circumferential disc bulge. Ligamentum flavum thickening and facet arthrosis. Likely mild spinal stenosis and mild bilateral foraminal stenosis.    L4-5: Increased  disc height loss with calcified circumferential disc bulge-posterior disc osteophyte complex. Facet arthrosis and ligamentum flavum thickening. At least moderate spinal stenosis. Marked left and likely moderate right foraminal stenosis.    L5-S1: Facet arthrosis and ligamentum flavum thickening contributes to mild to moderate right foraminal stenosis.  Impression: 1.  Progressed now marked degenerative disc disease at L4-5 with calcified disc bulge-disc osteophyte complex contributing to at least moderate spinal stenosis and marked left and likely moderate right foraminal stenosis.    2.  Additional levels of mild stenosis (as detailed above), greatest of a mild to moderate degree involving the right neuroforamen at L5-S1.    By my electronic signature, I attest that I have personally reviewed the images for this examination and formulated the interpretations and opinions expressed in this report     Finalized by Shanna Cisco, M.D. on 06/01/2023 3:55 PM. Dictated by Nicholes Stairs, MD on 06/01/2023 3:38 PM.            ASSESSMENT / PLAN     Elisheba Mcdonnell is a 58 y.o. female with     1. Foraminal stenosis of lumbar region  [place] [facility] TRANSFORAMINAL LUMBAR/SACRAL THERAPEUTIC      2. Cervical spondylosis with radiculopathy        3. Spinal stenosis of lumbar region with neurogenic claudication        4. Cervical stenosis of spine        5. Chronic midline low back pain with sciatica, sciatica laterality unspecified  [place] [facility] TRANSFORAMINAL LUMBAR/SACRAL THERAPEUTIC      6. Degeneration of intervertebral disc of lumbar region with discogenic back pain and lower extremity pain  [place] [facility] TRANSFORAMINAL LUMBAR/SACRAL THERAPEUTIC      7. DDD (degenerative disc disease), cervical        8. Spinal stenosis of lumbar region with radiculopathy  [place] [facility] TRANSFORAMINAL LUMBAR/SACRAL THERAPEUTIC          With regard to her neck, I don't know that I have anything to offer her from a surgery standpoint that would make her neck and arm pain any better given her lackluster response to the injection.  Her pain could be residual from the previous fusion, nerve irritation from her surgery, or muscular in nature.  I think she can discuss other options with Dr. Evelena Leyden.  She can certainly seek another opinion.      With regard to her lumbar spine, there are certainly findings on the MRI, but nothing that is severe.  I think the one area that I could focus attention on would be L4-5.  I think given the fact that she got relief with an injection targeting L4-5 in the past, I would send her for a transforaminal epidural steroid injection at L4-5.  This would not only be potentially diagnostic, but therapeutic in nature.  We will plan to follow up with the patient 6-8 weeks after the injection to review how they responded to this and for further discussion of treatment options.           In the presence of Marcelline Deist, MD , I have taken down these notes, Mamie Laurel, Scribe. June 14, 2023 9:51 AM    Dwyane Luo. Lisette Grinder, MD, MPH  Spinal Surgery  [place]. Clydene Pugh MD, [facility]  Nurse: Laverle Patter, BSN, RN, CNOR   [phone]  -  [email or web address]

## 2023-06-15 DIAGNOSIS — M51362 Other intervertebral disc degeneration, lumbar region with discogenic back pain and lower extremity pain: Secondary | ICD-10-CM

## 2023-06-15 DIAGNOSIS — M5416 Radiculopathy, lumbar region: Secondary | ICD-10-CM

## 2023-06-15 DIAGNOSIS — G8929 Other chronic pain: Secondary | ICD-10-CM

## 2023-06-23 ENCOUNTER — Encounter: Admit: 2023-06-23 | Discharge: 2023-06-23

## 2023-06-30 ENCOUNTER — Ambulatory Visit: Admit: 2023-06-30 | Discharge: 2023-06-30 | Payer: PRIVATE HEALTH INSURANCE

## 2023-06-30 ENCOUNTER — Encounter: Admit: 2023-06-30 | Discharge: 2023-06-30 | Payer: PRIVATE HEALTH INSURANCE

## 2023-06-30 DIAGNOSIS — M48061 Spinal stenosis, lumbar region without neurogenic claudication: Secondary | ICD-10-CM

## 2023-06-30 DIAGNOSIS — M5440 Lumbago with sciatica, unspecified side: Secondary | ICD-10-CM

## 2023-06-30 DIAGNOSIS — M51362 Degeneration of intervertebral disc of lumbar region with discogenic back pain and lower extremity pain: Secondary | ICD-10-CM

## 2023-06-30 MED ORDER — IOHEXOL 300 MG IODINE/ML IV SOLN
1 mL | Freq: Once | 0 refills | Status: CP
Start: 2023-06-30 — End: ?

## 2023-06-30 MED ORDER — SODIUM CHLORIDE 0.9 % IJ SOLN
2 mL | Freq: Once | INTRAMUSCULAR | 0 refills | Status: CP
Start: 2023-06-30 — End: ?

## 2023-06-30 MED ORDER — DEXAMETHASONE SODIUM PHOS (PF) 10 MG/ML IJ EPIDURAL SOLN
10 mg | Freq: Once | EPIDURAL | 0 refills | Status: CP
Start: 2023-06-30 — End: ?

## 2023-06-30 MED ORDER — LIDOCAINE (PF) 10 MG/ML (1 %) IJ SOLN
2 mL | Freq: Once | INTRAMUSCULAR | 0 refills | Status: CP
Start: 2023-06-30 — End: ?

## 2023-06-30 NOTE — Procedures
 Attending Surgeon: Bailey Bolus, MD    Anesthesia: Local    Pre-Procedure Diagnosis:   1. Chronic midline low back pain with sciatica, sciatica laterality unspecified    2. Degeneration of intervertebral disc of lumbar region with discogenic back pain and lower extremity pain    3. Foraminal stenosis of lumbar region    4. Spinal stenosis of lumbar region with radiculopathy        Post-Procedure Diagnosis:   1. Chronic midline low back pain with sciatica, sciatica laterality unspecified    2. Degeneration of intervertebral disc of lumbar region with discogenic back pain and lower extremity pain    3. Foraminal stenosis of lumbar region    4. Spinal stenosis of lumbar region with radiculopathy             [place] [facility] TRANSFORAMINAL LUMBAR/SACRAL THERAPEUTIC  Procedure: transforaminal epidural    Laterality: bilateral   on 06/30/2023 9:45 AM  Location: lumbar - L4-5      Consent:   Consent obtained: written  Consent given by: patient    Discussed with patient the purpose of the treatment/procedure, other ways of treating my condition, including no treatment/ procedure and the risks and benefits of the alternatives. Patient has decided to proceed with treatment/procedure.        Universal Protocol:  Relevant documents: relevant documents present and verified  Test results: test results available and properly labeled  Imaging studies: imaging studies available  Required items: required blood products, implants, devices, and special equipment available  Site marked: the operative site was marked  Patient identity confirmed: Patient identify confirmed verbally with patient.        Time out: Immediately prior to procedure a time out was called to verify the correct patient, procedure, equipment, support staff and site/side marked as required      Procedures Details:   Indications: pain   Prep: chlorhexidine  Estimated Blood Loss: minimal  Specimens: none  Number of Levels: 1  Approach: paramedian (bilateral)  Guidance: fluoroscopy  Contrast: Procedure confirmed with contrast under live fluoroscopy.  Needle and Epidural Catheter: quincke  Needle size: 25 G  Injection procedure: Negative aspiration for blood  Amount Injected:   L4-5: 3mL  Patient tolerance: Patient tolerated the procedure well with no immediate complications. Pressure was applied, and hemostasis was accomplished.  Comments: 5mg  dexamethasone and 1ml PF NS injected at each of 2 locations  This patient's clinical history, exam, AND imaging support radiculopathy AND there is a significant impact on quality of life and function AND the pain has been present for at least 4 weeks AND they have failed to improve with noninvasive conservative care.  This patient had at least 50% pain relief for at least 3 months with the last epidural injection.      Administrations This Visit       dexamethasone PF (DECADRON) epidural injection 10 mg       Admin Date  06/30/2023 Action  Given Dose  10 mg Route  Epidural Documented By  Nils Baseman, RN              iohexoL (OMNIPAQUE-300) 300 mg/mL injection 1 mL       Admin Date  06/30/2023 Action  Given Dose  1 mL Route  SEE ADMIN INSTRUCTIONS Documented By  Nils Baseman, RN              lidocaine PF 1% (10 mg/mL) injection 2 mL       Admin  Date  06/30/2023 Action  Given Dose  2 mL Route  Injection Documented By  Nils Baseman, RN              sodium chloride  PF 0.9% injection 2 mL       Admin Date  06/30/2023 Action  Given Dose  2 mL Route  Injection Documented By  Nils Baseman, RN                  Estimated blood loss: none or minimal  Specimens: none  Patient tolerated the procedure well with no immediate complications. Pressure was applied, and hemostasis was accomplished.

## 2023-06-30 NOTE — Discharge Instructions - Supplementary Instructions
 GENERAL POST PROCEDURE INSTRUCTIONS  Physician: _________________________________  Procedure Completed Today:  Joint Injection (hip, knee, shoulder)  Cervical Epidural Steroid Injection  Cervical Transforaminal Steroid Injection  Trigger Point Injection  Caudal Epidural Steroid Injection  Piriformis Injection  Pudendal Nerve Block  Other _____________________ Thoracic Epidural Steroid Injection  Lumbar Epidural Steroid Injection  Lumbar Transforaminal Steroid Injection  Facet Joint Injection  Celiac Nerve Block  Sacrococcygeal  Sacroiliac Joint Injection   Important information following your procedure today:  You may drive today     If you had sedation, you may NOT drive today  Rest at home for the next 6 hours.  You may then begin to resume your normal activities.  DO NOT drive any vehicle, operate any power tools, drink alcohol, make any major decisions, or sign any legal documents for the next 12 hours.  Pain relief may not be immediate. It is possible you may even experience an increase in pain during the first 24-48 hours followed by a gradual decrease of your pain.  Though the procedure is generally safe, and complications are rare, we do ask that you be aware of any of the following:  Any swelling, persistent redness, new bleeding or drainage from the site of the injection.  You should not experience a severe headache.  You should not run a fever over 101oF.  New onset of sharp, severe back and or neck pain.  New onset of upper or lower extremity numbness or weakness.  New difficulty controlling bowel or bladder function after injection.  New shortness of breath.  ** If any of these occur, please call to report this occurrence to the nurse of Dr. Evelena Leyden at [phone]. If you are calling after 4:00 p.m. or on weekends or holidays, please call [phone] and ask to have the resident physician on call for the physician paged or go to your local emergency room.  You may experience soreness at the injection site. Ice can be applied at 20-minute intervals for the first 24 hours. The following day you may alternate ice with heat if you are experiencing muscle tightness, otherwise continue with ice. Ice works best at decreasing pain. Avoid application of direct heat, hot showers or hot tubs today.  Avoid strenuous activity today. You many resume your regular activities and exercise tomorrow.  Patients with diabetes may see an elevation in blood sugars for 7-10 days after the injection. It is important to pay close attention to your diet, check your blood sugars daily and report extreme elevations to the physician that manages your diabetes.  Patients taking daily blood thinners can resume their regular dose this evening.  It is important that you take all medications ordered by your pain physician. Taking medications as ordered is an important part of your pain care plan. If you cannot continue the medication plan, please notify the physician.    Possible side effects to steroids that may occur:  Flushing or redness of the face  Irritability  Fluid retention  Change in women's menses  Minor headache    If you are unable to keep your upcoming appointment, please notify the [facility] Center scheduler at [phone] at least 24 hours in advance. If you have questions for the surgery center, call [facility] at [phone].

## 2023-06-30 NOTE — Progress Notes
 SPINE CENTER  INTERVENTIONAL PAIN PROCEDURE HISTORY AND PHYSICAL    Chief Complaint: Pain    HISTORY OF PRESENT ILLNESS:    Referred by Dr. Pennelope Bowler for bilateral L4-5 TFESI  Low back and BLE pain  Tried PT, medications    Past Medical History:    ADHD (attention deficit hyperactivity disorder)    Asthma    Cognitive impairment    Degenerative disc disease, cervical    Degenerative disc disease, lumbar    Degenerative disc disease, thoracic    Difficult intubation    Diverticulitis of colon (without mention of hemorrhage)(562.11)    Dyshidrotic eczema    Fatty liver    Fibromyalgia    Heart murmur    Hypoxia    IBS (irritable bowel syndrome)    Kidney stones    Migraines    Motion sickness    Muscle disease or syndrome    Nerve injury    Sleep apnea    Spinal stenosis       Surgical History:   Procedure Laterality Date    APPENDECTOMY  1983    HX HYSTERECTOMY  1984    CHOLECYSTECTOMY  2002    CARPAL TUNNEL RELEASE Bilateral 2004    HAND SURGERY Left 2004    thumb    LYSIS OF ADHESIONS  2011    abdominal adhesiolysis surgery    KNEE SURGERY Left 2014    meniscus repair    KIDNEY STONE SURGERY  05/2017    7mm stone removed    RIGHT ACHILLES TAKEDOWN AND REPAIR Right 05/18/2017    Performed by Velta Gianotti, MD at [facility]    REPAIR ACHILLES TENDON Left 12/22/2017    Performed by Velta Gianotti, MD at [facility]    PLANTAR FASCIA RELEASE Left 12/22/2017    Performed by Vopat, Cleone Dad, MD at [facility]    FUSION SPINE ANTERIOR WITH DISCECTOMY/ DECOMPRESSION - CERVICAL 6-7 WITH ANTERIOR SPINAL INSTRUMENTATION Right 03/06/2018    Performed by Tenna Fees, MD at [facility]    MICROSURGICAL TECHNIQUES - REQUIRING OPERATING MICROSCOPE USE N/A 03/06/2018    Performed by Tenna Fees, MD at [facility]    COLONOSCOPY      GASTRIC BYPASS      12/2017    HYSTEROSCOPY      endometriosis    PR LAPAROSCOPY SURG RPR INITIAL INGUINAL HERNIA  2011    SHOULDER SURGERY Bilateral 2003, 2004    SURGERY  Surgery twice on both shoulders. First in 2002 on tight side, last is left in september 2024.       family history includes Alcohol liver disease in her father; Cancer in her maternal aunt and maternal uncle; Coronary Artery Disease in her father; Diabetes in her father and mother; Heart problem in her father; Other in her maternal grandfather; Transient Ischaemic Attack in her father.    Social History     Socioeconomic History    Marital status: Married   Tobacco Use    Smoking status: Never    Smokeless tobacco: Never   Substance and Sexual Activity    Alcohol use: Never    Drug use: Never    Sexual activity: Yes     Partners: Male     Birth control/protection: Surgical       Allergies   Allergen Reactions    Adhesive Tape (Rosins) RASH    Bee [Venom-Honey Bee] EDEMA     carries epipen    Latex RASH  Succinylcholine MUSCLE PAIN    Benadryl [Diphenhydramine Hcl] SEE COMMENTS     restless leg syndrome    Chromium REDNESS    Codeine NAUSEA AND VOMITING    Hydrocodone ITCHING    Metoclopramide  STOMACH UPSET    Morphine NAUSEA AND VOMITING    Oxycodone  ITCHING     tolerates with antihistamine       Vitals:    06/30/23 0918   BP: (!) 147/89   BP Source: Arm, Right Upper   Pulse: 74   Temp: 36.5 ?C (97.7 ?F)   SpO2: 99%   O2 Device: None (Room air)   Weight: 73.9 kg (163 lb)   Height: 157.5 cm (5' 2)        Oswestry Total Score:: 60    REVIEW OF SYSTEMS: 10 point ROS obtained and negative except neck pain      PHYSICAL EXAM:  General: Alert, cooperative, no acute distress.  HEENT: Normocephalic, atraumatic.  Neck: Supple.  Lungs: Unlabored respirations, bilateral and equal chest excursion.  Heart: Regular rate.  Skin: Warm and dry to touch.  Abdomen: Nondistended.  MSK: No deformity.  Neurological: Alert and oriented x3.          IMPRESSION:    1. Chronic midline low back pain with sciatica, sciatica laterality unspecified    2. Degeneration of intervertebral disc of lumbar region with discogenic back pain and lower extremity pain    3. Foraminal stenosis of lumbar region    4. Spinal stenosis of lumbar region with radiculopathy         PLAN:   Bilat L4-5 TFESI

## 2023-07-28 ENCOUNTER — Encounter: Admit: 2023-07-28 | Discharge: 2023-07-28 | Payer: PRIVATE HEALTH INSURANCE

## 2023-08-01 NOTE — Patient Instructions
 It was a pleasure seeing you in clinic today.  Please don't hesitate to call or send a MyChart message if you have any questions.     DO NOT call or send a MyChart message asking for any test or imaging (MRI, CT, etc.) results.  All results will be discussed with you at your follow-up appointment.     Laverle Patter RN, BSN  Clinical Nurse Coordinator  Dr. Greer Pickerel  Alexsis Johnson APRN-NP   Coralie Common PA  Vaiden Darland PA  Sue Lush PA  [facility]  [facility] A[facility]  [address]  [place], [place] [address]  [email or web address]  Phone: [phone]  Fax:  [phone]  Scheduling [phone]

## 2023-08-04 ENCOUNTER — Ambulatory Visit: Admit: 2023-08-04 | Discharge: 2023-08-05 | Payer: PRIVATE HEALTH INSURANCE

## 2023-08-04 ENCOUNTER — Encounter: Admit: 2023-08-04 | Discharge: 2023-08-04 | Payer: PRIVATE HEALTH INSURANCE

## 2023-08-04 DIAGNOSIS — M4722 Other spondylosis with radiculopathy, cervical region: Secondary | ICD-10-CM

## 2023-08-04 DIAGNOSIS — M51362 Degeneration of intervertebral disc of lumbar region with discogenic back pain and lower extremity pain: Secondary | ICD-10-CM

## 2023-08-04 DIAGNOSIS — M4802 Spinal stenosis, cervical region: Secondary | ICD-10-CM

## 2023-08-04 DIAGNOSIS — M961 Postlaminectomy syndrome, not elsewhere classified: Secondary | ICD-10-CM

## 2023-08-04 DIAGNOSIS — M48061 Spinal stenosis, lumbar region without neurogenic claudication: Secondary | ICD-10-CM

## 2023-08-04 DIAGNOSIS — M5440 Lumbago with sciatica, unspecified side: Secondary | ICD-10-CM

## 2023-08-04 NOTE — Progress Notes
 [facility] CLINIC NOTE       SUBJECTIVE:     08/04/23  Here for re-evaluation of severe left upper extremity pain which has been refractory to RFA, CESI, Cervical TFESI therapies, medications and conservative management. No new traumas injuries or falls. Is wanting to consider spinal cord stimulator at this time.     As a brief reminder patient has had the following  Bilateral L4-L5 TFESI on 06/30/23  Bilateral C5-6 TFESI on 05/19/24  She had L4-5 ILESI on the right on 12/17/2022.  10/25/2022 she had right L5-S1 ILESI.  On 09/17/2022 she had bilateral L4-5 ILESI.  She reports she got the most relief with the injection in July.  Left C5-C7 RFA on 04/08/2020  C6-7 ACDF with Dr. Lodema Rimes in 2019     06/30/23  Referred by Dr. Pennelope Bowler for bilateral L4-5 TFESI  Low back and BLE pain  Tried PT, medications    05/20/23  Referred by Dr. Pennelope Bowler for bilateral C5-6 TFESI  Neck and BUE pain  Tried PT, medications    06/10/20:  Ashley Gaines presents with worsening of pain in left side of her neck.  This is in the posterior neck and radiates to the left upper shoulder as well as the left scapula.  This is similar to pain she has experienced in the past.  She underwent cervical radiofrequency ablation for this pain in January.  Unfortunately, she believes her pain is worsened since then.  She does notice pain that radiates down her left upper extremity at times.  She reports her arm strength has been okay.  Her pain is definitely worse in the cold weather.  It is temporarily improved by heating pads, which she uses frequently.  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  Any amount of neck  movement increases her pain.  She is using a muscle relaxant at night but it makes her too sleepy to use during the day.  She also continues to have pain in the lower back.  She had 1 day of good improvement with the trigger point injection, then pain rapidly returned.  She is completed physical therapy for her neck as well as several her medications without much pain relief.  She has previously undergone cervical epidural steroid injections without benefit.     01/08/20:  Ashley Gaines returns today for pain in the left side of the neck radiating to the left shoulder.  She recently had a cervical epidural steroid injection.  Unfortunately, she has not noticed any relief with this.  Her pain has not changed compared to her description in the previous HPI detailed below.     She also mentions another area of pain she would like to have evaluated.  This is in the mid back.  It is just above and below her bra strap, immediately below the shoulder blades.  This pain has been present for several years and gradually worsening.  There is no inciting event.  The pain is mostly in the middle of the back but occasionally radiates to the bilateral posterior chest.  It does not radiate to the anterior chest.  She denies any numbness or tingling in the region.  It is worse when she wears a bra and better when she removes her bra.  She had MRI of the thoracic spine in 2019 to evaluate this.  This was normal.  She has tried the medications listed below which have not helped with this  type of pain.  She has never had any interventions to the thoracic spine.  She wonders if some of her thoracic pain is related to strain from wearing a bra with large breasts.      11/20/19:  Ashley Gaines is referred today by neurosurgeon Dr. Nolon Baxter for neck and left shoulder pain.  This pain started after a C6-7 ACDF in 2019.  She did not have any similar pain before hand.  It is primarily in the base of the left neck and in the left posterior upper trapezius area.  It is a constant sharp and burning sensation.  It is worse with shoulder movements.  Sometimes it radiates very slightly distal to the shoulder but not any further into the arm.  She denies any numbness, tingling, or weakness in the bilateral upper extremities.  Her pain is worse when wearing a bra.  She has a history of left shoulder surgery several years ago but this pain is different.  Massage and physical therapy have not provided any long-term relief.  She has tried baclofen, duloxetine, and gabapentin without any relief.  Tramadol helps mildly.  Trigger point injections did not help on 2 different occasions.  She is also being evaluated for syncope as a separate problem.  She lives approximately 90 minutes from [facility].  She recently had plain films and MRI of the cervical spine.           Pain diagram:                    Current Outpatient Medications:     albuterol sulfate (PROAIR HFA) 90 mcg/actuation HFA aerosol inhaler, INHALE 1-2 PUFFS BY MOUTH EVERY 6 HOURS AS NEEDED, Disp: 25.5 g, Rfl: 1    amphetamine-dextroamphetamine XR (ADDERALL XR) 15 mg capsule, Take by mouth daily., Disp: , Rfl:     ascorbic acid (VITAMIN C) 500 mg tablet, Take 1 tablet by mouth twice daily., Disp: , Rfl:     baclofen (LIORESAL) 10 mg tablet, , Disp: , Rfl:     cholecalciferol (VITAMIN D-3) 1,000 units tablet, Take 1,000 Units by mouth daily., Disp: , Rfl:     Cyanocobalamin-Cobamamide (B12) 5,000-100 mcg lozg, Place  under tongue., Disp: , Rfl:     DETROL LA 2 mg capsule, , Disp: , Rfl:     docusate (COLACE) 100 mg capsule, Take one capsule by mouth twice daily as needed for Constipation., Disp: 60 capsule, Rfl: 0    duloxetine DR (CYMBALTA) 60 mg capsule, Take 60 mg by mouth twice daily., Disp: , Rfl:     famotidine (PEPCID) 20 mg tablet, Take 20 mg by mouth twice daily., Disp: , Rfl:     fexofenadine(+) (ALLEGRA) 180 mg tablet, Take 180 mg by mouth at bedtime daily., Disp: , Rfl:     fludrocortisone (FLORINEF) 0.1 mg tablet, Take one tablet by mouth daily., Disp: 90 tablet, Rfl: 2    hydrocortisone 2.5 % topical ointment, Apply  topically to affected area twice daily as needed., Disp: 30 g, Rfl: 1    MAGNESIUM PO, Take 1 tablet by mouth daily. 100mg , Disp: , Rfl:     meloxicam (MOBIC) 15 mg tablet, Take one tablet by mouth daily. TAKE FOR 14 DAYS FOR INFLAMMATION, THEN STOP. REPEAT PRN., Disp: 90 tablet, Rfl: 0    methocarbamoL (ROBAXIN) 750 mg tablet, Take one tablet by mouth three times daily as needed for Spasms., Disp: 90 tablet, Rfl: 2  omeprazole DR (PRILOSEC) 20 mg capsule, Take 20 mg by mouth twice daily., Disp: , Rfl:     other medication, [employer] Bariastic Multivitamin 1 tablet by mouth twice daily, Disp: , Rfl:     Potassium 99 mg tab, Take  by mouth., Disp: , Rfl:     pregabalin (LYRICA) 25 mg capsule, TAKE ONE CAPSULE BY MOUTH THREE TIMES DAILY, Disp: 90 capsule, Rfl: 1    PROZAC 20 mg capsule, , Disp: , Rfl:     SUMAtriptan succinate (IMITREX) 100 mg tablet, Take one tablet by mouth once as needed., Disp: , Rfl:     traMADoL (ULTRAM) 50 mg tablet, Take one tablet by mouth every 6 hours as needed for Pain., Disp: , Rfl:     traZODone (DESYREL) 50 mg tablet, Take  by mouth., Disp: , Rfl:     trazodone HCl (DESYREL PO), , Disp: , Rfl:     ultrasonic cleaner,holding sol (POWER CLEANER MISC), Use 400 mg as directed., Disp: , Rfl:     zinc sulfate 220 mg (50 mg elemental zinc) capsule, Take 220 mg by mouth daily., Disp: , Rfl:   Allergies   Allergen Reactions    Adhesive Tape (Rosins) RASH    Bee [Venom-Honey Bee] EDEMA     carries epipen    Latex RASH    Succinylcholine MUSCLE PAIN    Benadryl [Diphenhydramine Hcl] SEE COMMENTS     restless leg syndrome    Chromium REDNESS    Codeine NAUSEA AND VOMITING    Hydrocodone ITCHING    Metoclopramide STOMACH UPSET    Morphine NAUSEA AND VOMITING    Oxycodone ITCHING     tolerates with antihistamine     Physical Exam  HENT:      Head: Normocephalic and atraumatic.   Eyes:      Extraocular Movements: Extraocular movements intact.      Pupils: Pupils are equal, round, and reactive to light.   Cardiovascular:      Rate and Rhythm: Normal rate.   Pulmonary:      Effort: Pulmonary effort is normal.   Abdominal:      Palpations: Abdomen is soft. Musculoskeletal:      Right lower leg: No edema.      Left lower leg: No edema.      Comments: Negative hawkins, negative neer, negative yergasons,   Positive scarfs. Pain and tenderness over the [facility] joint   Neurological:      Mental Status: Ashley Gaines is alert and oriented to person, place, and time.      Comments: 5/5 strength in the upper and lower extremities. Reflexes intact and symmetric. Negative hoffmans. Sensation grossly intact.   Psychiatric:         Mood and Affect: Mood normal.       Vitals:    08/04/23 1513   BP: (!) 142/80   Pulse: 78   SpO2: 98%   PainSc: Six   Weight: 73.9 kg (163 lb)   Height: 157.5 cm (5' 2)      Telehealth Patient Reported Vitals       Row Name 08/04/23 1513                Pain Score SIX                       Oswestry Total Score:: (Patient-Rptd) 64  Pain Score: Six  Body mass index is 29.81 kg/m?Aaron Aas  Assessment:    1. Cervical radiculopathy    2. Cervical spondylosis with radiculopathy    3. Cervical stenosis of spine    4. Chronic midline low back pain with sciatica, sciatica laterality unspecified    5. Degeneration of intervertebral disc of lumbar region with discogenic back pain and lower extremity pain    6. Foraminal stenosis of lumbar region    7. Spinal stenosis of lumbar region with radiculopathy        Plan:  1.  Lifestyle modification.  Continue current activities as tolerated.    2.  Medication.    -  Continue medications as scheduled.  3.  Therapy.    - Continue HEP as previously provided  4.  Interventions.    - Provided information for spinal cord stimulation. Will schedule her for SCS trial. She will undergo psychological evaluation first and referral will be sent.   - Patient to discuss next steps with lumbar spine pain with Dr. Pennelope Bowler, would be more than glad to repeat injections as needed.  5.  Diagnostics.  No new imaging to be ordered at this time.   - Patient will forward us  her platelet results  6.  Follow-up.  Patient will follow-up as scheduled for SCS trial.      Risks/benefits of all pharmacologic and interventional treatments discussed and questions answered.     Thank you for this kind referral for consultation. Please feel free to contact me with any questions or concerns.     This note was completed by Hilbert Loving D.O. PM&R PGY4 under the supervision of Dr. Rosellen Conners    ATTESTATION    I personally performed the key portions of the E/M visit, discussed case with the resident and concur with documentation of history, physical exam, assessment, and treatment plan unless otherwise noted.    Staff name:  Bailey Bolus, MD Date:  08/04/2023

## 2023-08-05 DIAGNOSIS — M5412 Radiculopathy, cervical region: Secondary | ICD-10-CM

## 2023-08-08 ENCOUNTER — Encounter: Admit: 2023-08-08 | Discharge: 2023-08-08 | Payer: PRIVATE HEALTH INSURANCE

## 2023-08-09 ENCOUNTER — Encounter: Admit: 2023-08-09 | Discharge: 2023-08-09 | Payer: PRIVATE HEALTH INSURANCE

## 2023-08-11 ENCOUNTER — Encounter: Admit: 2023-08-11 | Discharge: 2023-08-11 | Payer: PRIVATE HEALTH INSURANCE

## 2023-08-11 ENCOUNTER — Ambulatory Visit: Admit: 2023-08-11 | Discharge: 2023-08-12 | Payer: PRIVATE HEALTH INSURANCE

## 2023-08-11 DIAGNOSIS — M549 Dorsalgia, unspecified: Secondary | ICD-10-CM

## 2023-08-11 NOTE — Progress Notes
 Marc A. Wilda Handsome, MD [facility]  Follow - Up Visit  Subjective     REASON FOR VISIT   Follow Up and Pain of the Lower Back, Pain of the Left Leg, and Pain of the Right Leg    SUBJECTIVE     Mr. Ashley Gaines presents for follow-up evaluation following transforaminal epidural steroid injection bilaterally at L4-5 on June 30, 2023 by Dr. Rosellen Conners.  She reports by the third day she was feeling quite some.  She said the first day she got left leg relief, the second day she got right leg relief, the third day she had low back relief.  And she did really well for about 3 and half weeks.  She reports she was sleeping and felt like a new woman.        In regards to her neck, she has tried several different types of injections and ablations, she is working with Dr. Rosellen Conners and they are discussing a spinal cord stimulator trial at this point.    She reports her ribs pop out of place on the upper left front side and her shoulder will hurt. She pops her ribs back in and she will get some relief from her shoulder pain.  She reports she can feel the ribs popping out of place in the back as well but she is not able to pop them back into place so she has pain back behind her left shoulder blade at times.    She also has a mid back pain that she has discussed in the past with Dr. Pennelope Gaines, she is asking about a breast reduction and if that could help relieve some of that back pain she feels.         ROS: Review of Systems   Constitutional: Negative.    HENT: Negative.     Eyes: Negative.    Respiratory: Negative.     Cardiovascular: Negative.    Gastrointestinal: Negative.    Endocrine: Negative.    Genitourinary: Negative.    Musculoskeletal:  Positive for back pain.   Skin: Negative.    Allergic/Immunologic: Negative.    Neurological: Negative.    Hematological: Negative.    Psychiatric/Behavioral: Negative.       A 10-point ROS was performed and negative.    PHYSICAL EXAM   Blood pressure 134/70, pulse 91, height 157.5 cm (5' 2), weight 76.3 kg (168 lb 4 oz), last menstrual period 08/26/1982, SpO2 100%.  Body mass index is 30.77 kg/m?Ashley Gaines  Oswestry Total Score:: (Patient-Rptd) 60  Pain Score: Seven    Constitutional: Alert, NAD  Head: Atraumatic  Eyes: EOMI  Respiratory: Unlabored breathing  Cardiovascular: Regular rate  Skin: No rashes or open wounds appreciated on back  Musculoskeletal: Strength stable  Neurologic: Sensation stable      RADIOGRAPHS     No new imaging.       ASSESSMENT / PLAN     Ashley Gaines is a 58 y.o. female with     1. Mid back pain  AMB REFERRAL TO PLASTIC SURGERY    AMB REFERRAL TO GENERAL SURGERY    CANCELED: AMB REFERRAL TO GENERAL SURGERY      2. Foraminal stenosis of lumbar region  MRI L-SPINE WO CONTRAST      3. Spinal stenosis of lumbar region with radiculopathy  MRI L-SPINE WO CONTRAST      4. Lumbar pain  MRI L-SPINE WO CONTRAST          I  think in regards to the response from her injection, she had great relief.  Unfortunately, it only lasted 3-1/2 weeks.  I think she could consider some type of surgical intervention for her lumbar spine.  Likely an L4-5 fusion type procedure.  Her MRI is from June 2024, I would like an updated MRI of the lumbar spine to make sure there is no other pathology going on from a year ago versus now before making a final surgical plan.  We will get this scheduled for her at a mutually convenient time and have her follow-up with Dr. Pennelope Gaines afterwards for definitive surgery planning.      I think in regards to her ribs, she could start with seeing if a trauma/general surgeon deals with that type of issue versus another type of provider, I am not sure.  As far as a breast reduction to help her mid back pain this could be discussed with the plastics surgeon team to see if this would be of benefit in her situation.     Ashley Fear, APRN, FNP-C   Nurse Practitioner-Spine Surgery    Ashley B. Pennelope Bowler, MD, MPH  Spinal Surgery  Ashley Gerald. Wilda Handsome MD, [facility]  Nurse: Sallyanne Creamer, BSN, RN, CNOR   [phone]  -  [email or web address]

## 2023-08-12 ENCOUNTER — Encounter: Admit: 2023-08-12 | Discharge: 2023-08-12 | Payer: PRIVATE HEALTH INSURANCE

## 2023-08-12 DIAGNOSIS — M545 Low back pain, unspecified: Secondary | ICD-10-CM

## 2023-08-12 DIAGNOSIS — M5416 Radiculopathy, lumbar region: Secondary | ICD-10-CM

## 2023-08-12 DIAGNOSIS — M48061 Spinal stenosis, lumbar region without neurogenic claudication: Secondary | ICD-10-CM

## 2023-08-14 ENCOUNTER — Encounter: Admit: 2023-08-14 | Discharge: 2023-08-14 | Payer: PRIVATE HEALTH INSURANCE

## 2023-08-15 ENCOUNTER — Encounter: Admit: 2023-08-15 | Discharge: 2023-08-15 | Payer: PRIVATE HEALTH INSURANCE

## 2023-08-15 NOTE — Telephone Encounter
 Received vm from [facility] Radiology scheduling asking for PA information for MRI.  If we do not do the PA, asking for last clinic note to be faxed to [phone] and they will obtain PA.  Clinic note from 5/29 faxed as requested.

## 2023-08-16 ENCOUNTER — Encounter: Admit: 2023-08-16 | Discharge: 2023-08-16 | Payer: PRIVATE HEALTH INSURANCE

## 2023-08-16 ENCOUNTER — Ambulatory Visit: Admit: 2023-08-16 | Discharge: 2023-08-17 | Payer: PRIVATE HEALTH INSURANCE

## 2023-08-16 NOTE — Progress Notes
 Subjective:       History of Present Illness     Ashley Gaines is a 58 y.o. female with symptomatic macromastia    Patient presents to clinic to discuss symptomatic macromastia. Patient currently wears a 38G/H. Patient would like to be more proportional to body habitus and feels she would be more comfortable at C cup. Patient endorses postural changes, shoulder pain, neck pain, upper/mid back pain, and headache. Has had symptoms for >35 years. Patient regularly has indentations of shoulders from brasserie straps and rashes beneath her breasts. For the rashes has tried prescription nystatin ointment, oral diflucan, goes without bra, will tuck clothing under breast folds to keep skin dry, anti-chaffing deodorant stick, coconut oil with magnesium, various styles/speciality bras, will put panty liners in her bra to help absorb the sweat. Patient has performed chiropractic and physical therapy. Patient has tried and had minimal benefits with the following medication(s) including NSAID, Lidocaine, and Biofreeze, tramadol for aches and pains. Has been evaluated by orthopedics for back pain, had an MRI about a year ago noting degenerative disc disease, bulging disc with radiating pain down legs. States they are working her up for potential surgery and possible nerve stimulator.        Pertinent PMH: ADHD, arthritis, diverticulitis, eczema, fibromyalgia, heart murmur (was told has murmur by anesthesia, but no one including PCP has ever heard of a heart murmur), IBS, migraines, spinal stenosis    Pertinent PSH: gastric bypass (2019), achilles tendon surgery, appendectomy, carpal tunnel release, cervical fusion, chole, hernia repair, hysterectomy, kidney stone surgery, lysis of adhesions, shoulder surgery    Social History: denies nicotine use or second hand nicotine exposure. Lives with husband. Work: retired, has Secondary school teacher business. G0P0         Family Hx of breast cancer: no immediate family history, two maternal aunts (diagnosed in their 6s)    Last mammogram: Mar 28 2023, completed at [place]. Does not have the results, but will get them to us                Review of Systems      Objective:          albuterol sulfate (PROAIR HFA) 90 mcg/actuation HFA aerosol inhaler INHALE 1-2 PUFFS BY MOUTH EVERY 6 HOURS AS NEEDED    amphetamine-dextroamphetamine XR (ADDERALL XR) 15 mg capsule Take by mouth daily. (Patient taking differently: Take 25 mg by mouth daily.)    ascorbic acid (VITAMIN C PO) Take  by mouth.    ascorbic acid (VITAMIN C) 500 mg tablet Take 1 tablet by mouth twice daily.    baclofen (LIORESAL) 10 mg tablet     cholecalciferol (VITAMIN D-3) 1,000 units tablet Take 1,000 Units by mouth daily.    cholecalciferol (vitD3)/vit K2 (VITAMIN D3-VITAMIN K2) 125 mcg (5,000 unit)-100 mcg cap Take  by mouth.    Cyanocobalamin-Cobamamide (B12) 5,000-100 mcg lozg Place  under tongue.    DETROL LA 2 mg capsule     docusate (COLACE) 100 mg capsule Take one capsule by mouth twice daily as needed for Constipation.    duloxetine DR (CYMBALTA) 60 mg capsule Take 60 mg by mouth twice daily.    estradiol valerate (DELESTROGEN) 10 mg/mL injection INJECT 1ml (10mg ) INTRAMUSCULARLY EVERY 4 weeks    famotidine (PEPCID) 20 mg tablet Take 20 mg by mouth twice daily.    fexofenadine(+) (ALLEGRA) 180 mg tablet Take 180 mg by mouth at bedtime daily.    fludrocortisone (FLORINEF) 0.1 mg  tablet Take one tablet by mouth daily.    hydrocortisone 2.5 % topical ointment Apply  topically to affected area twice daily as needed.    L.acid/L.casei/B.bif/B.lon/FOS (PROBIOTIC BLEND PO) Take  by mouth.    magnesium hydroxide (MAGNESIA PO) Take  by mouth.    MAGNESIUM PO Take 1 tablet by mouth daily. 100mg     meloxicam (MOBIC) 15 mg tablet Take one tablet by mouth daily. TAKE FOR 14 DAYS FOR INFLAMMATION, THEN STOP. REPEAT PRN. (Patient not taking: Reported on 08/16/2023)    methocarbamoL (ROBAXIN) 750 mg tablet Take one tablet by mouth three times daily as needed for Spasms.    omeprazole DR (PRILOSEC) 20 mg capsule Take 20 mg by mouth twice daily.    other medication [facility] B[facility] Multivitamin 1 tablet by mouth twice daily    Potassium 99 mg tab Take  by mouth.    pregabalin (LYRICA) 25 mg capsule TAKE ONE CAPSULE BY MOUTH THREE TIMES DAILY    PROZAC 20 mg capsule     SUMAtriptan succinate (IMITREX) 100 mg tablet Take one tablet by mouth once as needed.    traMADoL (ULTRAM) 50 mg tablet Take one tablet by mouth every 6 hours as needed for Pain.    traZODone (DESYREL) 50 mg tablet Take  by mouth.    trazodone HCl (DESYREL PO)     ultrasonic cleaner,holding sol (POWER CLEANER MISC) Use 400 mg as directed.    zinc sulfate 220 mg (50 mg elemental zinc) capsule Take 220 mg by mouth daily.     Vitals:    08/16/23 1143   BP: (!) 140/82   Pulse: 85   Temp: 36.9 ?C (98.4 ?F)   PainSc: Seven   Weight: 77.9 kg (171 lb 12.8 oz)   Height: 158.8 cm (5' 2.5)     Body mass index is 30.92 kg/m?Ashley Gaines   Body surface area is 1.85 meters squared.       Physical Exam  Vitals reviewed.   Chest:      Comments: Bilateral macromastia   Shoulder notching  Upper pole striae  Intertrigo no active infection but hyperpigmentation from chronic infection    Nipple sensation present  Size right hangs lower than left  Ptosis 2  BD R 15 L 14.5  Internipple distance  20  SN to N R 30 L 29.5  N to IMF R 15 L 18           Skin:     General: Skin is warm and dry.   Neurological:      Mental Status: Ashley Gaines is alert and oriented to person, place, and time.                                              Assessment and Plan:      Ashley Gaines is a 58 y.o. female with symptomatic macromastia    - she is an excellent candidate for breast reduction surgery. With BSA of 1.85 will need to remove 482g/side    - I went through the breast reduction information packet discussing breast cup size is not standard and I cannot predict cup size after surgery.  Insurance requires a specific amount of breast weight (in grams) be removed base on body surface area. Additionally, they prefer failed conservative treatments including but not limited to: weight loss, treatments by PCP,  prescription and OTC meds, PT, chiropractor, personal training, massage therapy, etc.    - Incisions- discussed and showed examples of incisions for breast reduction which are typically wise pattern or anchor incisions that can sometimes extend into flanks and sometime meet in the center of chest to correct synmastia. Discussed it is normal to have some breakdown/scabs/wounds on incisions where incisions meet- at I circled those areas on the packet drawing of incisions.    - Activity- We discussed activity restrictions after surgery including 6 weeks of not lifting >10lbs and no strenuous.   - Bras- will need to wear a non-under wire bra, preferably with front closure day/night except in shower for at least 6 weeks, with padding along IMF, will need to cont to wear non-under wire bra for 54mo to allow healing.  Swelling persists for approximately 54mo.  Thus recommend waiting 3 mo before bra shopping and getting sized.  Will be able to wear any bra as comfortable after that time.    -I discussed the risks, benefits, alternatives, and limitations of surgery. We spoke about bleeding, infection, incision separation, fat necrosis, loss of her nipple and areola, altered coloration of her nipple and areola, altered sensation of her nipple and areola, seroma formation, scarring, asymmetry, unacceptable cosmesis with potential boxy shape. She needs support after the surgery as I do not want her to lift any heavy objects or do strenuous activity for 4-6 weeks.   - I discussed with her the process of submitting to insurance for approval   - I informed her to make sure her insurance co doesn't have a clause stating breast reduction is not a covered benefit.   - discussed that if prior authorization is denied, it might be mean that it is not a covered benefit, that more conservative treatments might be required or that not enough tissue needs to be removed.    - sometimes suggest that her PCP prescribe  PT for neck, back, shoulder pain related to her macromastia, see other practitioners like chiropractor and/or additional topical treatments for intertrigo.  If these do not help her symptoms, we can re-submit if/when it fails.   - can also give her a quote for cosmetic pricing if not covered after all attempts.     - needs current MMG prior to surgery (ordered by PCP) and any other suggested imaging or work up completed prior to preop, will need early MMG if surgery is done w/I 54mo of due date for yearly mammagrams.     Need to have copy of results at the time of their preop appt. Then recommend post op 35mo as a baseline for future surgeries.  If not done, will be canceled/rescheduled.     - will need mammogram results prior to preop with Dr Henreitta Locus    - discussed the lateral chest adiposity is not covered by insurance and considered cosmetic. Ashley Gaines would like cosmetic quote for excision lateral chest adiposity.         - discussed with Ashley Gaines, will hold off on scheduling any breast reduction surgery until she has further workup for her back issues. Since they are discussing surgery, will need to hold off on scheduling breast reduction until after she has had her back surgery. She will need a follow up with me after her back surgery after she has been released from surgery restrictions to ensure we can go ahead and schedule her breast reduction, update cosmetic quote and obtain breast photos. Ashley Gaines will reach out to our  office after she has more information about her back surgery.     - all of Ashley Gaines questions were answered to their satisfaction and she expressed understanding.        Surgery will be scheduled with Dr Henreitta Locus  But will wait on scheduling until another follow up with me after her back surgery  Operation: Bilateral breast reduction, possible free nipple graft (insurance 4 hours, outpt, drains, superior medial pedicle, wise pattern), +/- excision bilateral chest adiposity (cosmetic, cot 40981, 1 hr)  Codes:     19318 Breast reduction BILATERAL  19318-50  Surgical time (plastic): see above  Anesthetic: choice,   Position: supine  arms <90degrees, wrapped, secured above and below elbows to arm boards  be able to sit up- ensure hips and shoulders are square and even on bed  no underbody bear hugger to prevent sliding on bed.   plan to pull and tuck under axillary tissue- ensure leads are not visible when pulling     Implants: n/a  Tissue/Mesh: n/a  Special Equipment: nipple sizers 38mm,   61french jp hubless x 2,  CHG tegaderm x 2  Txa- IV    Admission vs Day Surgery: outpt       Total Time Today was 60 minutes in the following activities: Preparing to see the patient, Obtaining and/or reviewing separately obtained history, Performing a medically appropriate examination and/or evaluation, Counseling and educating the patient/family/caregiver, Documenting clinical information in the electronic or other health record, and Care coordination (not separately reported)        Ashley Goodell, APRN-NP

## 2023-08-17 DIAGNOSIS — N62 Hypertrophy of breast: Secondary | ICD-10-CM

## 2023-08-17 DIAGNOSIS — M549 Dorsalgia, unspecified: Secondary | ICD-10-CM

## 2023-08-19 ENCOUNTER — Encounter: Admit: 2023-08-19 | Discharge: 2023-08-19 | Payer: PRIVATE HEALTH INSURANCE

## 2023-08-19 NOTE — Progress Notes
 Sent a secure e-mail to [email or web address] with the cosmetic quote attached.

## 2023-08-22 ENCOUNTER — Encounter: Admit: 2023-08-22 | Discharge: 2023-08-22 | Payer: PRIVATE HEALTH INSURANCE

## 2023-08-22 NOTE — Telephone Encounter
 Followed up with the patient on the cosmetic quote I sent. Patient informed she did receive it and is not wanting to add it on at the moment. Patient will call when she is ready to schedule the breast reduction.

## 2023-08-23 ENCOUNTER — Encounter: Admit: 2023-08-23 | Discharge: 2023-08-23 | Payer: PRIVATE HEALTH INSURANCE

## 2023-08-24 NOTE — Patient Instructions
 It was a pleasure seeing you in clinic today.  Please don't hesitate to call or send a MyChart message if you have any questions.     DO NOT call or send a MyChart message asking for any test or imaging (MRI, CT, etc.) results.  All results will be discussed with you at your follow-up appointment.     Laverle Patter RN, BSN  Clinical Nurse Coordinator  Dr. Greer Pickerel  Alexsis Johnson APRN-NP   Coralie Common PA  Vaiden Darland PA  Sue Lush PA  [facility]  [facility] A[facility]  [address]  [place], [place] [address]  [email or web address]  Phone: [phone]  Fax:  [phone]  Scheduling [phone]

## 2023-08-25 ENCOUNTER — Encounter: Admit: 2023-08-25 | Discharge: 2023-08-25 | Payer: PRIVATE HEALTH INSURANCE

## 2023-08-26 ENCOUNTER — Encounter: Admit: 2023-08-26 | Discharge: 2023-08-26 | Payer: PRIVATE HEALTH INSURANCE

## 2023-08-26 NOTE — Telephone Encounter
 Progress Note  Social Work Sports coach        Plan:   Spinal Cord Stimulator Trial Evaluation Referral          Intervention:   SW reviewed [facility] Workqueue List for new SCS Trial referrals placed.  Dr. Jackline in the [facility] placed a Spinal Cord Stimulator (SCS) Trial Evaluation on 08/04/23.            SW completed the SCS Trial Evaluation.  Please see separate note for full evaluation details.              Damian Banco, LMSW, ACM-SW  SW Case Manager  [facility]  [phone]

## 2023-08-26 NOTE — Telephone Encounter
 Social Work Spinal Cord Stimulator Evaluation      Date: 08/26/2023     Name:  Ashley Gaines [facility] MRN #: [id number]       Referral Source: Dr. Jackline                                               Referral Reason: SCS Implant  Device: Severa    Date Referred:  08/04/23   Date Interviewed: 08/26/23       Source of Information:  Patient via phone                                       Patient's Address:  [address] [place] [address]                                     Telephone #:  [phone] (home)                                                                                       Birthdate: 18-May-1965    Age: 59 y.o.    Primary Language:  English       This evaluation completed by Damian Banco, LMSW, [facility] Social Worker.      Patient is a 58 y.o. year old who presents for initial evaluation for a Spinal Cord Stimulator (SCS).  Patient met with Dr. Jackline on 08/04/23 who discussed the SCS procedure and is agreeable to moving forward with the evaluation process.        This SW obtained referral from work que to complete the SW SCS Evaluation.    SW contacted the patient on 08/26/23 to complete a phone evaluation.      Patient identifies what a SCS is and the reason they are being referred for the procedure?: Yes.  Follow up with RN or SCS Rep needed?:  Yes  Pt is agreeable to being contacted by the SCS Device Rep, but does not currently have any specific questions to be answered.  Pt understands this procedure will not likely provide complete pain relief, rather, it is intended to provide meaningful pain reduction.      Patient received handout information about SCS Trial during clinic appt?: Yes   Device brochure? Yes      Send handout via MyChart / EMAIL / Snail mail? Email verified ([email or web address])      SW reviewed handout with Patient? Yes      Patient has identified their transportation and in home support for their recovery?: not specifically  Pt is aware of the transportation requirements for the procedure.      Patient has available leave from their Employer?: Yes   Pt is self employed.      SW reviewed general overview of the next steps in the evaluation process, including the scheduling  of a Psychiatric Evaluation and Insurance Authorization approval?: Yes      Patient prefers to schedule the Psychiatric Evaluation at [facility]? Yes  Patient understands the potential wait time for an appointment can take up to several weeks? Yes  [facility] [phone]  Appt scheduled 09/02/23 at 10:00am.      Patient is open to or prefers to schedule the Psychiatric Evaluation with an outside Provider? No   [facility] [phone]  NA      Patient understands [facility] Social Worker and Scheduling can assist with providing contact information for a Psychiatric Evaluation with an outsider Provider, but can not guarantee insurance benefits or coverage.  That will be the responsibility of the patient to follow up with the outsider Provider.  Yes          Final Decision  Patient does wish to proceed with the SCS procedure.      Follow Up Needs:  SW to email printed handout w/contact #s for Psych Eval, SCS Coordinators and RN.  Psych Eval scheduled w/[facility] on 09/02/23 at 10:00am.  SW to notify [facility] Rep to call pt for f/u.      Evaluation routed to [facility] to remind about pt's allergy to adhesive.        Damian Banco, LMSW, ACM-SW  Social Work Musician  [phone]

## 2023-08-29 ENCOUNTER — Encounter: Admit: 2023-08-29 | Discharge: 2023-08-29 | Payer: PRIVATE HEALTH INSURANCE

## 2023-08-30 ENCOUNTER — Encounter: Admit: 2023-08-30 | Discharge: 2023-08-30 | Payer: PRIVATE HEALTH INSURANCE

## 2023-08-30 ENCOUNTER — Ambulatory Visit: Admit: 2023-08-30 | Discharge: 2023-08-31 | Payer: PRIVATE HEALTH INSURANCE

## 2023-08-30 DIAGNOSIS — M48061 Spinal stenosis, lumbar region without neurogenic claudication: Secondary | ICD-10-CM

## 2023-08-30 DIAGNOSIS — M5416 Radiculopathy, lumbar region: Secondary | ICD-10-CM

## 2023-08-30 DIAGNOSIS — M51362 Degeneration of intervertebral disc of lumbar region with discogenic back pain and lower extremity pain: Secondary | ICD-10-CM

## 2023-08-30 NOTE — Progress Notes
 Marc A. Veria, [facility]  Follow - Up Visit  Subjective     REASON FOR VISIT   Pain of the Lower Back; Pain of the Right Hip; Pain of the Left Hip; Pain, Numbness, and Weakness of the Right Leg; Pain, Numbness, and Weakness of the Left Leg; and Other (MRI REVIEW)    SUBJECTIVE     Ashley Gaines returns today to review MRI findings.  She has constant right buttock and leg pain but says once she starts moving or with activity, her left hip/buttock hurts worse.  She also has intermittent episodes where her left leg goes numb.  She has persistent low back pain as well.           ROS: Review of Systems   Musculoskeletal:  Positive for arthralgias and back pain.   Neurological:  Positive for weakness and numbness.   All other systems reviewed and are negative.    A 10-point ROS was performed and negative.    PHYSICAL EXAM   Blood pressure (!) 148/70, pulse 81, temperature 36.7 ?C (98 ?F), temperature source Temporal, height 157.5 cm (5' 2), weight 76.3 kg (168 lb 3.2 oz), last menstrual period 08/26/1982, SpO2 98%.  Body mass index is 30.76 kg/m?SABRA  Oswestry Total Score:: (Patient-Rptd) 64  Pain Score: Seven    Constitutional: Alert, NAD  Head: Atraumatic  Eyes: EOMI  Respiratory: Unlabored breathing  Cardiovascular: Regular rate  Skin: No rashes or open wounds appreciated on back  Musculoskeletal: Strength stable  Neurologic: Sensation stable    Unchanged.    RADIOGRAPHS     MRI lumbar spine shows marked disc degeneration L4-5.  Proximal and distal to this the discs looks relatively well maintained.  Discs look.  There is asymmetric collapse to the right side and there is severe foraminal stenosis on the right and interestingly because of hypertrophic facet morphology, there is also severe stenosis on the left compressing the exiting nerve root at this level.                ASSESSMENT / PLAN     Neeti Knudtson Loranda Mastel is a 58 y.o. female with     1. Degeneration of intervertebral disc of lumbar region with discogenic back pain and lower extremity pain        2. Foraminal stenosis of lumbar region        3. Stenosis of lateral recess of lumbar spine        4. Spinal stenosis of lumbar region with radiculopathy        5. Lumbar radicular pain            She has tried and failed non-operative treatments.  She has had recent epidural injections.  She continues to have radicular type pain on the right side that is persistent in the buttock and down the leg.  Left sided pain is more associated with certain activities or getting up and moving and walking.  Looking at her imaging studies once again, she has severe disc degeneration, asymmetric disc collapse, and near bone on bone contact at L4-5.  She has bilateral severe foraminal stenosis at this level.  Her new MRI, which was read above also confirms this.    On her coronal view it does look like she has a very subtle spondylolisthesis with lateral listhesis that does concern me.      Surgical plan:  Based on this and in my hands, I think she needs a structural  support in the form of an interbody device.  The asymmetric collapse and bone on bone contact results in severe foraminal stenosis and there is also a lateral listhesis at this level.    With that, I would make the following recommendations:  I believe that a lateral lumbar interbody fusion would allow me to place an interbody with a large footprint to increase fusion potential as well as properly prepare the disc space.  I also find that laterals are very effective in my hands for elevating with neural foramen effectively and maintaining that height whereas TLIF cages can sometimes subside or lose our correction.  I would also add posterior spinal instrumentation and fusion at this level.  This would all be targeting L4-5, which is the level of pathology that I see.    She has had a previous CT scan, which I believe we can use from March of 2025 and would be perfectly acceptable for the purposes of surgical planning.  I would use stereotactic spinal navigation to place the screws through muscle sparing techniques.  I am happy to help her at a mutually convenient date if she wishes to proceed.         In the presence of Penne KATHEE Rubenstein, MD , I have taken down these notes, Joan Barefoot, Scribe. August 30, 2023 3:21 PM    Penne B. Rubenstein, MD, MPH  Spinal Surgery  [facility] MD, [facility]  Nurse: Olam Danas, BSN, RN, CNOR   [phone]  -  [email or web address]

## 2023-09-02 ENCOUNTER — Encounter: Admit: 2023-09-02 | Discharge: 2023-09-02 | Payer: PRIVATE HEALTH INSURANCE

## 2023-09-02 ENCOUNTER — Ambulatory Visit: Admit: 2023-09-02 | Discharge: 2023-09-03 | Payer: PRIVATE HEALTH INSURANCE

## 2023-09-02 DIAGNOSIS — F54 Psychological and behavioral factors associated with disorders or diseases classified elsewhere: Secondary | ICD-10-CM

## 2023-09-02 DIAGNOSIS — M4722 Other spondylosis with radiculopathy, cervical region: Secondary | ICD-10-CM

## 2023-09-02 DIAGNOSIS — Z01818 Encounter for other preprocedural examination: Secondary | ICD-10-CM

## 2023-09-02 NOTE — Progress Notes
 PATIENT MRN: [id number]  PATIENT NAME: Ashley Gaines  DATE OF SERVICE: 09/02/23  START VISIT: 1005  STOP VISIT:  1045  REFERRAL SOURCE: Dr. Prentice Ashley Gaines    DOB: October 10, 1965  AGE: 58 y.o.  SEX: female  RACE: white  EDUCATION:   12th grade    Daron, patient's husband, was present during this evaluation per her preference.      PREOPERATIVE PSYCHOLOGICAL EVALUATION FOR SPINAL CORD STIMULATOR    REASON FOR REFERRAL: Ashley Gaines is a 58 y.o. female who was referred by Dr. Sinks for a psychological evaluation to assess mental strength and wellbeing related to candidacy for spinal cord stimulator.      PAIN HISTORY: Ashley Gaines began having pain in her L shoulder related to spinal fusion that she had around 2019.  Jenkins has tried multiple forms of intervention including: RFA, CESI, Cervical TFESI therapies, medications, ice/heat, surgery.  Patient noted the most benefit from ice/heat, though relief is temporary.  Patient's average pain is 7, with a range of 5 to 9.  Jenkins reported it impacts Ashley Gaines's daily functioning in various ways, with the biggest impact related to Ashley Gaines's ability to engage in daily activities.    MENTAL HEALTH HISTORY: Ashley Gaines denied any history of mental health diagnoses or concerns. Ashley Gaines reported taking trazodone for sleep and prozac  for migraines, with benefit. Ashley Gaines denied seeing a counselor or therapist.  Ashley Gaines has never been hospitalized for psychiatric issues.  Ashley Gaines denied a history of suicidal or homicidal ideation, plans, or attempts. Ashley Gaines denied a history of self-harm behaviors.      CURRENT MENTAL HEALTH: Ashley Gaines endorsed the following symptoms    Depression No    Anxiety No    Panic attacks No    PTSD No    OCD No    Mania No    Psychosis No    Hallucinations No    Delusions No    Self-harm behaviors No    Suicidal ideation No    Homicidal ideation No        Current psychiatric medications: trazodone, prozac   Current psychiatric provider: PCP    SUBSTANCE USE:   Tobacco: The patient denied lifetime use of tobacco products.               Length of use: n/a   Amount of use: n/a              Last use: n/a               Form of use: n/a  Drugs: The patient denied lifetime illicit drug use.                Length of use: n/a              Last use: n/a              Jail/Prison: n/a              Detox/Rehab: n/a  Alcohol: The patient denied alcohol use,  noting that she is allergic.               Length of use:               Number of drinks:               Last drink:  DUIs: denied              Jail/Prison: denied              Detox/Rehab: denied              Work/Relationship issues due to alcohol: denied               Addiction counseling/AA: denied    Access to Firearms: Ashley Gaines. Jenkins was educated about safe storage of firearms.       CURRENT HEALTH BEHAVIORS: Ashley Gaines reported a normal appetite and diet. Ashley Gaines reported walking for exercise, though she is not able to do much due to her lower back pain. Ashley Gaines reported getting 4 hours of sleep per night on average. Ashley Gaines takes D3, collagen peptides, vitamin C, magnesium , probiotics. Ashley Gaines denied caffeine use.        PAST MEDICAL HISTORY:  Past Medical History:    ADHD (attention deficit hyperactivity disorder)    Allergy    Arthritis    Asthma    Breast disorder    Cognitive impairment    Degenerative disc disease, cervical    Degenerative disc disease, lumbar    Degenerative disc disease, thoracic    Difficult intubation    Diverticulitis of colon (without mention of hemorrhage)(562.11)    Dyshidrotic eczema    Fatty liver    Fibromyalgia    Heart murmur    Hypoxia    IBS (irritable bowel syndrome)    Kidney stones    Migraines    Motion sickness    Muscle disease or syndrome    Nerve injury    Neuromuscular disorder (CMS-HCC)    Sleep apnea    Spinal stenosis       ALLERGIES:   Allergies   Allergen Reactions    Adhesive Tape (Rosins) RASH Bee [Venom-Honey Bee] EDEMA     carries epipen    Latex RASH    Succinylcholine MUSCLE PAIN    Benadryl [Diphenhydramine Hcl] SEE COMMENTS     restless leg syndrome    Chromium REDNESS    Codeine NAUSEA AND VOMITING    Hydrocodone ITCHING    Metoclopramide  STOMACH UPSET    Morphine NAUSEA AND VOMITING    Oxycodone  ITCHING     tolerates with antihistamine       PAST SURGICAL HISTORY:    Surgical History:   Procedure Laterality Date    APPENDECTOMY  1983    HX HYSTERECTOMY  1984    CHOLECYSTECTOMY  2002    CARPAL TUNNEL RELEASE Bilateral 2004    HAND SURGERY Left 2004    thumb    LYSIS OF ADHESIONS  2011    abdominal adhesiolysis surgery    KNEE SURGERY Left 2014    meniscus repair    KIDNEY STONE SURGERY  05/2017    7mm stone removed    RIGHT ACHILLES TAKEDOWN AND REPAIR Right 05/18/2017    Performed by Niels Dorise MATSU, MD at [facility]    REPAIR ACHILLES TENDON Left 12/22/2017    Performed by Niels Dorise MATSU, MD at [facility]    PLANTAR FASCIA RELEASE Left 12/22/2017    Performed by Vopat, Dorise MATSU, MD at [facility]    FUSION SPINE ANTERIOR WITH DISCECTOMY/ DECOMPRESSION - CERVICAL 6-7 WITH ANTERIOR SPINAL INSTRUMENTATION Right 03/06/2018    Performed by Rosalita Pastor, MD at [facility] OR    MICROSURGICAL TECHNIQUES -  REQUIRING OPERATING MICROSCOPE USE N/A 03/06/2018    Performed by Rosalita Pastor, MD at [facility]    SHOULDER SURGERY  2024    Surgery twice on both shoulders. First in 2002 on right side, last is left in september 2024.    COLONOSCOPY      GASTRIC BYPASS      12/2017    HERNIA REPAIR  2011    small repair by the belly button.    HX SMALL BOWEL RESECTION  2011    adhesion surgery    HYSTEROSCOPY      endometriosis    PR LAPAROSCOPY SURG RPR INITIAL INGUINAL HERNIA  2011    SHOULDER SURGERY Bilateral 2003, 2004    SPINE SURGERY  02-2018    Fused neck    SURGERY  2002       SOCIAL BACKGROUND/HISTORY: Ashley Gaines currently lives in [place], [place] with her husband, Daron.  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  Jenkins is currently married to her husband Daron, for the past 35 years, and described their relationship as good.  They are supportive of the patient?s pursuit of SCS implant.  The patient has two children.       UNDERSTANDING OF SURGERY: Kamelia Lampkins demonstrated an adequate awareness of what the surgery would entail. Ashley Gaines demonstrated awareness of the risks of infection and need for follow-up care following the surgery. Jenkins also demonstrated understanding of the lifestyle changes associated with more favorable outcomes of surgery.  Additionally, the patient reported willingness to seek surgery under these conditions and evidenced intent to comply with related behavioral recommendations. Ashley Gaines's expectations for pain reduction are appropriate. Ashley Gaines's motives for surgery are appropriate; Ashley Gaines desires to improve Ashley Gaines's health and quality of life.      Caregiver Information: Daron will serve as patient?s caregiver following surgery.     PSYCHOMETRIC TEST RESULTS:  Clinically significant scores are bolded  PROMIS (Patient Reported Outcomes Measurement Information System)               Physical Function: Raw score = 8/30              Anxiety: Raw score = 7/30              Depression: Raw score = 6/30              Fatigue: Raw score = 27/30              Sleep Disturbance: Raw score = 29/30              Social Role: Raw score = 6/30              Pain Interference: Raw score = 30/30              Pain Intensity: 7/10    [place] Scale for Kinesiophobia (higher scores indicate increased fear of movement):  40/68    Pain Catastrophizing Scale:  Total = 23/52  Rumination = 7/16  Magnification = 4/12  Helplessness = 12/24    Alcohol Use Disorder Identification Test (AUDIT-C): 0; low risk               (>=3 females, >=4 males risk of alcohol use disorder)     Opioid Risk Tool (ORT):  1; low risk for opioid abuse               (<=3 low  risk, 4-7 moderate risk, >=8 high risk of opioid abuse)    MOCA: 29/30, patient?s memory and concentration fell in the normal range of functioning.     [facility]ford Integrated Psychosocial Assessment for Transplantation (SIPAT) SCORES:              (Note: Lower scores indicate stronger candidate status).  Patient?s Readiness Level: 0  Social Support System: 0  Psychological Stability & Psychopathology: 2  Lifestyle and effect of substance use: 0  SIPAT TOTAL SCORE: 2  SIPAT SCORE INTERPRETATION: Excellent Candidate    0-6 = Excellent Candidate (Recommend without reservations)   7-20 = Good Candidate (Recommend, although monitoring of identified risk factors may be required)  21-39 = Minimally Acceptable Candidate (Recommend under certain conditions, identified risk factors should be addressed before procedure)  40-68 = High Risk Candidate (Recommend deferral while identified risks are addressed)  >60 Poor Candidate (Procedure not recommended while identified risk factors continue to be present)    Mental Status Examination/Behavioral Observations:  General/Constitutional: Related appropriately.   Speech/Motor: Fluent. Normal for rate, rhythm, and tone   Mood/Affect: Good/Congruent affect.  Thought Process: Linear, goal directed, coherent, easy to understand  Associations: Intact  Thought Content:  Neg for delusions, phobias, and preoccupations.   Perception: Neg for AH, VH.   Insight/Judgement: fair/fair  Orientation: Alert/ oriented x 3  Recent and Remote Memory: Grossly intact  Attention span and concentration: Intact  Language: Unremarkable  Fund of knowledge and vocabulary: Good  Suicidal Ideation: Denied      IMPRESSIONS: Vickey Boak is a 58 y.o. female presenting with chronic pain.  Ashley Gaines denied lifetime substance abuse. Ashley Gaines also denied current psychiatric symptoms. Jenkins reported having a good support system.  Patient demonstrated a realistic outlook for this surgery and expected pain reduction.  Furthermore, Jenkins has a good understanding of the risks and benefits of the procedure.  Given this information, the patient appears to be a Excellent candidate for surgery.     Diagnostic Impression:      Pre-Surgical Evaluation for Spinal Cord Stimulator    Medical Diagnosis:   Cervical spondylosis with radiculopathy     Plan/Recommendations:   1. Encourage patient to review educational materials provided by [facility] and the following website: [email or web address]    2. Encourage patient to share educational materials with caregivers.        The proposed treatment plan was discussed with the patient who was provided the opportunity to ask questions and make suggestions regarding alternative treatment.      Thank you for the opportunity to be involved with this patient?s care.      Saddie Moan, PhD  Licensed Psychologist  Available on [place]     Total time spent by licensed psychologist: 90 mins   Diagnostic interview: 40 mins   [facility] admin/scoring, questionnaire scoring: 20 mins   SIPAT scoring, report writing, feedback: 30 mins

## 2023-09-08 ENCOUNTER — Encounter: Admit: 2023-09-08 | Discharge: 2023-09-08 | Payer: PRIVATE HEALTH INSURANCE

## 2023-09-13 ENCOUNTER — Inpatient Hospital Stay: Admit: 2023-09-13 | Discharge: 2023-09-13 | Payer: PRIVATE HEALTH INSURANCE

## 2023-09-13 ENCOUNTER — Encounter: Admit: 2023-09-13 | Discharge: 2023-09-13 | Payer: PRIVATE HEALTH INSURANCE

## 2023-09-14 ENCOUNTER — Encounter: Admit: 2023-09-14 | Discharge: 2023-09-14 | Payer: PRIVATE HEALTH INSURANCE

## 2023-09-15 ENCOUNTER — Encounter: Admit: 2023-09-15 | Discharge: 2023-09-15 | Payer: PRIVATE HEALTH INSURANCE

## 2023-09-15 MED FILL — PRESURGERY KIT B: ORAL | 1 days supply | Qty: 1 | Fill #0 | Status: AC

## 2023-09-20 ENCOUNTER — Encounter: Admit: 2023-09-20 | Discharge: 2023-09-20 | Payer: PRIVATE HEALTH INSURANCE

## 2023-09-21 ENCOUNTER — Encounter: Admit: 2023-09-21 | Discharge: 2023-09-21 | Payer: PRIVATE HEALTH INSURANCE

## 2023-09-21 ENCOUNTER — Ambulatory Visit: Admit: 2023-09-21 | Discharge: 2023-09-21 | Payer: PRIVATE HEALTH INSURANCE

## 2023-09-25 ENCOUNTER — Encounter: Admit: 2023-09-25 | Discharge: 2023-09-25 | Payer: PRIVATE HEALTH INSURANCE

## 2023-09-27 ENCOUNTER — Encounter: Admit: 2023-09-27 | Discharge: 2023-09-27 | Payer: PRIVATE HEALTH INSURANCE

## 2023-09-28 ENCOUNTER — Encounter: Admit: 2023-09-28 | Discharge: 2023-09-28 | Payer: PRIVATE HEALTH INSURANCE

## 2023-09-28 NOTE — Telephone Encounter
 Called patient and let her know that her authorization for surgery was in the appeal process. I encouraged her to keep the [facility] appointment in case the surgery gets approved. She gave verbal understanding

## 2023-09-30 ENCOUNTER — Encounter: Admit: 2023-09-30 | Discharge: 2023-09-30 | Payer: PRIVATE HEALTH INSURANCE

## 2023-09-30 NOTE — Telephone Encounter
 Notified pt surgery has been denied by insurance.  Appeal will be submitted and can take up to 30 days for approval determination.  Surgery rescheduled to 8/25.  Pt voices understanding.

## 2023-10-04 ENCOUNTER — Encounter: Admit: 2023-10-04 | Discharge: 2023-10-04 | Payer: PRIVATE HEALTH INSURANCE

## 2023-10-13 ENCOUNTER — Encounter: Admit: 2023-10-13 | Discharge: 2023-10-13 | Payer: PRIVATE HEALTH INSURANCE

## 2023-10-18 ENCOUNTER — Encounter: Admit: 2023-10-18 | Discharge: 2023-10-18 | Payer: PRIVATE HEALTH INSURANCE

## 2023-10-18 NOTE — Telephone Encounter
 Appeal letter for [facility] faxed to [facility] at [phone]. Fax receipt confirmed. RN will follow-up with updates when available.

## 2023-10-20 ENCOUNTER — Encounter: Admit: 2023-10-20 | Discharge: 2023-10-20 | Payer: PRIVATE HEALTH INSURANCE

## 2023-10-25 ENCOUNTER — Encounter: Admit: 2023-10-25 | Discharge: 2023-10-25 | Payer: PRIVATE HEALTH INSURANCE

## 2023-11-01 ENCOUNTER — Encounter: Admit: 2023-11-01 | Discharge: 2023-11-01 | Payer: PRIVATE HEALTH INSURANCE

## 2023-11-29 ENCOUNTER — Encounter: Admit: 2023-11-29 | Discharge: 2023-11-29 | Payer: PRIVATE HEALTH INSURANCE

## 2023-11-29 NOTE — Telephone Encounter
 RN called to check on status of Appeal Submitted. Call ref# [id number]. Per [facility] representative, the denial was upheld, with reason being that the procedure is not medically necessary and not a covered benefit under the members plan. Representative is faxing appeal outcome information to us  for review.

## 2023-12-01 ENCOUNTER — Encounter: Admit: 2023-12-01 | Discharge: 2023-12-01 | Payer: PRIVATE HEALTH INSURANCE

## 2024-02-28 ENCOUNTER — Encounter: Admit: 2024-02-28 | Discharge: 2024-02-28 | Payer: PRIVATE HEALTH INSURANCE

## 2024-03-11 ENCOUNTER — Encounter: Admit: 2024-03-11 | Discharge: 2024-03-11 | Payer: PRIVATE HEALTH INSURANCE

## 2024-03-28 ENCOUNTER — Encounter: Admit: 2024-03-28 | Discharge: 2024-03-28 | Payer: PRIVATE HEALTH INSURANCE
